# Patient Record
Sex: Male | Born: 1970
Health system: Southern US, Community
[De-identification: ages and names within clinical notes are randomized; demographics above are authoritative.]

## PROBLEM LIST (undated history)

## (undated) DIAGNOSIS — E119 Type 2 diabetes mellitus without complications: Secondary | ICD-10-CM

## (undated) DIAGNOSIS — R7402 Elevation of levels of lactic acid dehydrogenase (LDH): Secondary | ICD-10-CM

## (undated) DIAGNOSIS — E785 Hyperlipidemia, unspecified: Secondary | ICD-10-CM

## (undated) DIAGNOSIS — R7401 Elevation of levels of liver transaminase levels: Secondary | ICD-10-CM

## (undated) DIAGNOSIS — I10 Essential (primary) hypertension: Secondary | ICD-10-CM

## (undated) DIAGNOSIS — R740 Nonspecific elevation of levels of transaminase and lactic acid dehydrogenase [LDH]: Secondary | ICD-10-CM

## (undated) HISTORY — DX: Nonspecific elevation of levels of transaminase and lactic acid dehydrogenase (ldh): R74.0

## (undated) HISTORY — DX: Hyperlipidemia, unspecified: E78.5

## (undated) HISTORY — DX: Essential (primary) hypertension: I10

## (undated) HISTORY — DX: Type 2 diabetes mellitus without complications: E11.9

## (undated) HISTORY — DX: Elevation of levels of lactic acid dehydrogenase (LDH): R74.02

## (undated) HISTORY — DX: Elevation of levels of liver transaminase levels: R74.01

---

## 1998-12-18 HISTORY — PX: VASECTOMY: SHX75

## 2003-08-21 ENCOUNTER — Emergency Department (HOSPITAL_COMMUNITY): Admission: EM | Admit: 2003-08-21 | Discharge: 2003-08-22 | Payer: Self-pay | Admitting: Emergency Medicine

## 2003-08-21 ENCOUNTER — Encounter: Payer: Self-pay | Admitting: Emergency Medicine

## 2007-09-06 ENCOUNTER — Ambulatory Visit: Payer: Self-pay | Admitting: Family Medicine

## 2007-09-06 DIAGNOSIS — G43009 Migraine without aura, not intractable, without status migrainosus: Secondary | ICD-10-CM | POA: Insufficient documentation

## 2007-09-06 LAB — CONVERTED CEMR LAB
Bilirubin Urine: NEGATIVE
Blood in Urine, dipstick: NEGATIVE
Glucose, Urine, Semiquant: NEGATIVE
Ketones, urine, test strip: NEGATIVE
Nitrite: NEGATIVE
Protein, U semiquant: NEGATIVE
Specific Gravity, Urine: 1.01
Urobilinogen, UA: NEGATIVE
WBC Urine, dipstick: NEGATIVE
pH: 7.5

## 2007-09-10 ENCOUNTER — Ambulatory Visit: Payer: Self-pay | Admitting: Family Medicine

## 2007-09-11 ENCOUNTER — Encounter (INDEPENDENT_AMBULATORY_CARE_PROVIDER_SITE_OTHER): Payer: Self-pay | Admitting: *Deleted

## 2007-09-11 LAB — CONVERTED CEMR LAB
ALT: 54 units/L — ABNORMAL HIGH (ref 0–53)
AST: 30 units/L (ref 0–37)
Albumin: 4.1 g/dL (ref 3.5–5.2)
Alkaline Phosphatase: 92 units/L (ref 39–117)
BUN: 9 mg/dL (ref 6–23)
Basophils Absolute: 0 10*3/uL (ref 0.0–0.1)
Basophils Relative: 0.2 % (ref 0.0–1.0)
Bilirubin, Direct: 0.1 mg/dL (ref 0.0–0.3)
CO2: 28 meq/L (ref 19–32)
Calcium: 9.2 mg/dL (ref 8.4–10.5)
Chloride: 110 meq/L (ref 96–112)
Cholesterol: 162 mg/dL (ref 0–200)
Creatinine, Ser: 1 mg/dL (ref 0.4–1.5)
Eosinophils Absolute: 0.1 10*3/uL (ref 0.0–0.6)
Eosinophils Relative: 2.7 % (ref 0.0–5.0)
GFR calc Af Amer: 109 mL/min
GFR calc non Af Amer: 90 mL/min
Glucose, Bld: 116 mg/dL — ABNORMAL HIGH (ref 70–99)
HCT: 43.8 % (ref 39.0–52.0)
HDL: 23.7 mg/dL — ABNORMAL LOW (ref >39.0)
Hemoglobin: 15.4 g/dL (ref 13.0–17.0)
LDL Cholesterol: 111 mg/dL — ABNORMAL HIGH (ref 0–99)
Lipase: 34 units/L (ref 11.0–59.0)
Lymphocytes Relative: 31.8 % (ref 12.0–46.0)
MCHC: 35.2 g/dL (ref 30.0–36.0)
MCV: 90 fL (ref 78.0–100.0)
Monocytes Absolute: 0.6 10*3/uL (ref 0.2–0.7)
Monocytes Relative: 11.7 % — ABNORMAL HIGH (ref 3.0–11.0)
Neutro Abs: 2.9 10*3/uL (ref 1.4–7.7)
Neutrophils Relative %: 53.6 % (ref 43.0–77.0)
Platelets: 169 10*3/uL (ref 150–400)
Potassium: 4 meq/L (ref 3.5–5.1)
RBC: 4.86 M/uL (ref 4.22–5.81)
RDW: 11.8 % (ref 11.5–14.6)
Sodium: 143 meq/L (ref 135–145)
Total Bilirubin: 0.8 mg/dL (ref 0.3–1.2)
Total CHOL/HDL Ratio: 6.8
Total Protein: 6.7 g/dL (ref 6.0–8.3)
Triglycerides: 139 mg/dL (ref 0–149)
VLDL: 28 mg/dL (ref 0–40)
WBC: 5.3 10*3/uL (ref 4.5–10.5)

## 2009-03-19 ENCOUNTER — Emergency Department (HOSPITAL_COMMUNITY): Admission: EM | Admit: 2009-03-19 | Discharge: 2009-03-19 | Payer: Self-pay | Admitting: Emergency Medicine

## 2009-03-24 ENCOUNTER — Ambulatory Visit: Payer: Self-pay | Admitting: Family Medicine

## 2009-03-24 DIAGNOSIS — E1165 Type 2 diabetes mellitus with hyperglycemia: Secondary | ICD-10-CM | POA: Insufficient documentation

## 2009-03-24 DIAGNOSIS — E119 Type 2 diabetes mellitus without complications: Secondary | ICD-10-CM

## 2009-03-24 DIAGNOSIS — I10 Essential (primary) hypertension: Secondary | ICD-10-CM

## 2009-03-25 DIAGNOSIS — R740 Nonspecific elevation of levels of transaminase and lactic acid dehydrogenase [LDH]: Secondary | ICD-10-CM

## 2009-03-29 ENCOUNTER — Ambulatory Visit: Payer: Self-pay | Admitting: Family Medicine

## 2009-03-30 LAB — CONVERTED CEMR LAB
HCV Ab: NEGATIVE
Hep A IgM: NEGATIVE
Hep B C IgM: NEGATIVE
Hepatitis B Surface Ag: NEGATIVE

## 2009-03-31 ENCOUNTER — Encounter: Admission: RE | Admit: 2009-03-31 | Discharge: 2009-03-31 | Payer: Self-pay | Admitting: Family Medicine

## 2009-03-31 DIAGNOSIS — K760 Fatty (change of) liver, not elsewhere classified: Secondary | ICD-10-CM

## 2009-04-07 ENCOUNTER — Ambulatory Visit: Payer: Self-pay | Admitting: Family Medicine

## 2009-05-03 ENCOUNTER — Ambulatory Visit: Payer: Self-pay | Admitting: Family Medicine

## 2009-05-04 LAB — CONVERTED CEMR LAB
BUN: 15 mg/dL (ref 6–23)
CO2: 31 meq/L (ref 19–32)
Calcium: 9.1 mg/dL (ref 8.4–10.5)
Chloride: 111 meq/L (ref 96–112)
Creatinine, Ser: 1.1 mg/dL (ref 0.4–1.5)
GFR calc non Af Amer: 79.43 mL/min (ref >60)
Glucose, Bld: 108 mg/dL — ABNORMAL HIGH (ref 70–99)
Potassium: 3.8 meq/L (ref 3.5–5.1)
Sodium: 145 meq/L (ref 135–145)

## 2009-05-06 ENCOUNTER — Ambulatory Visit: Payer: Self-pay | Admitting: Family Medicine

## 2009-06-04 ENCOUNTER — Ambulatory Visit: Payer: Self-pay | Admitting: Family Medicine

## 2009-09-30 ENCOUNTER — Encounter (INDEPENDENT_AMBULATORY_CARE_PROVIDER_SITE_OTHER): Payer: Self-pay | Admitting: *Deleted

## 2010-06-08 ENCOUNTER — Emergency Department (HOSPITAL_COMMUNITY): Admission: EM | Admit: 2010-06-08 | Discharge: 2010-06-08 | Payer: Self-pay | Admitting: Family Medicine

## 2010-07-19 ENCOUNTER — Telehealth (INDEPENDENT_AMBULATORY_CARE_PROVIDER_SITE_OTHER): Payer: Self-pay | Admitting: *Deleted

## 2010-07-19 ENCOUNTER — Ambulatory Visit: Payer: Self-pay | Admitting: Family Medicine

## 2010-07-19 LAB — CONVERTED CEMR LAB
ALT: 57 units/L — ABNORMAL HIGH (ref 0–53)
Albumin: 4 g/dL (ref 3.5–5.2)
BUN: 14 mg/dL (ref 6–23)
Bilirubin, Direct: 0.1 mg/dL (ref 0.0–0.3)
CO2: 30 meq/L (ref 19–32)
Calcium: 9.4 mg/dL (ref 8.4–10.5)
Chloride: 108 meq/L (ref 96–112)
Cholesterol: 170 mg/dL (ref 0–200)
Creatinine, Ser: 1.1 mg/dL (ref 0.4–1.5)
HDL: 27.9 mg/dL — ABNORMAL LOW (ref >39.00)
Total Protein: 6.4 g/dL (ref 6.0–8.3)
Triglycerides: 200 mg/dL — ABNORMAL HIGH (ref 0.0–149.0)

## 2010-07-22 ENCOUNTER — Ambulatory Visit: Payer: Self-pay | Admitting: Family Medicine

## 2010-09-20 IMAGING — US US ABDOMEN COMPLETE
1 series · 14 of 25 positions shown · non-contrast
Comparison: None

CLINICAL DATA: Elevated liver function tests

COMPLETE ABDOMINAL ULTRASOUND

[Series 1: us abdomen complete · 0.35mm/px · 14 of 80 slices shown]
[im 1/80]
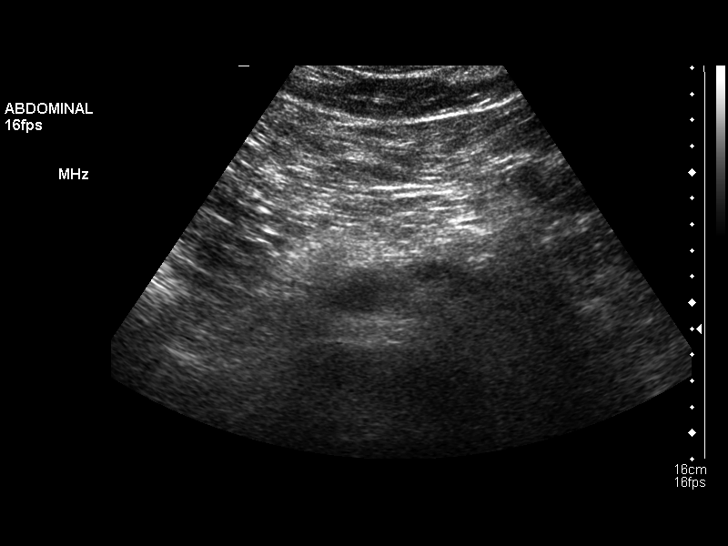
[im 7/80]
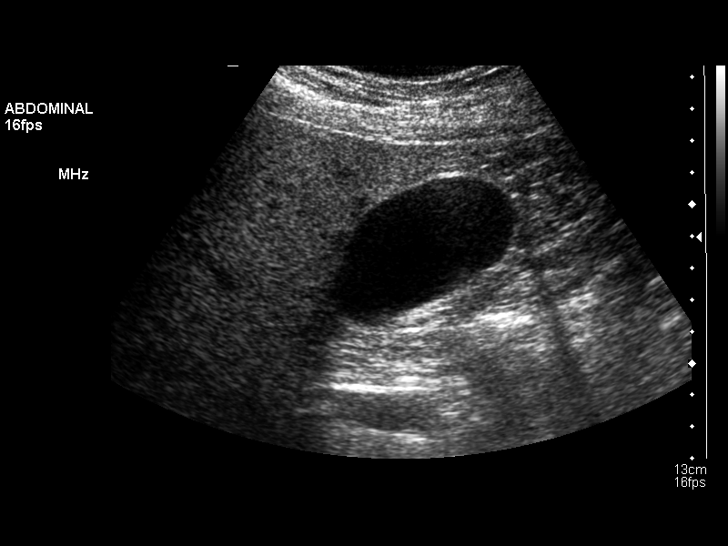
[im 14/80]
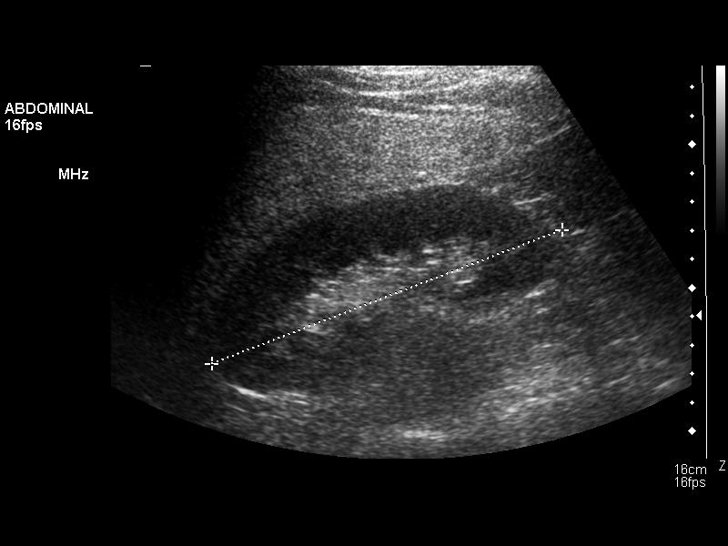
[im 20/80]
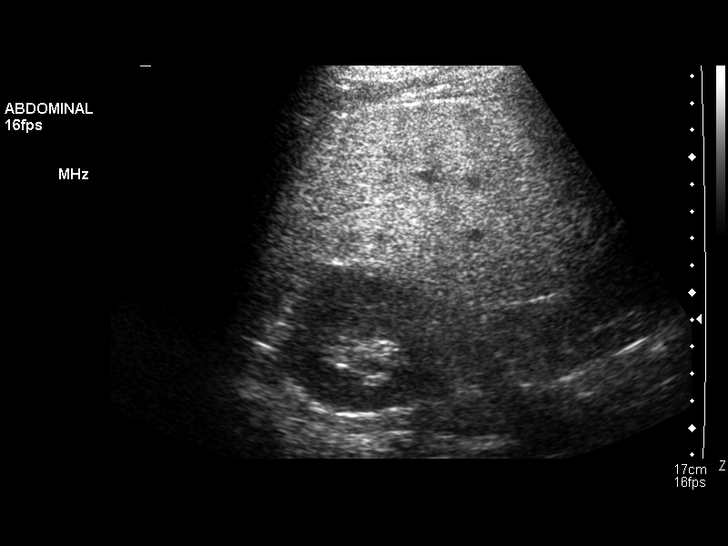
[im 27/80]
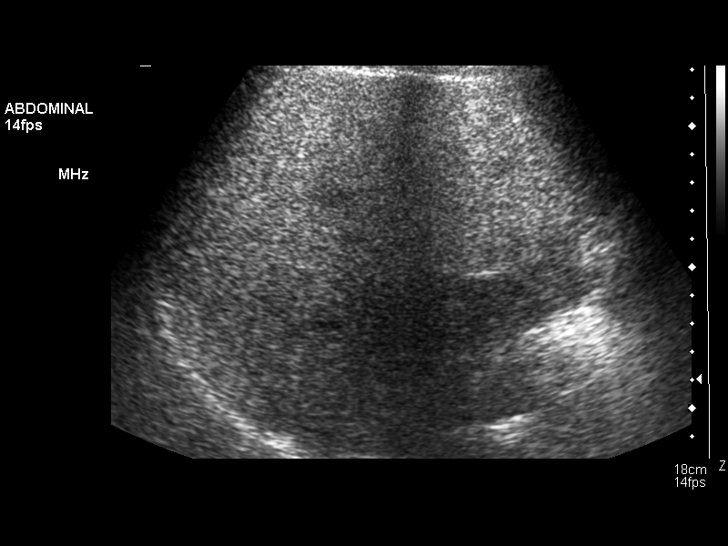
[im 30/80]
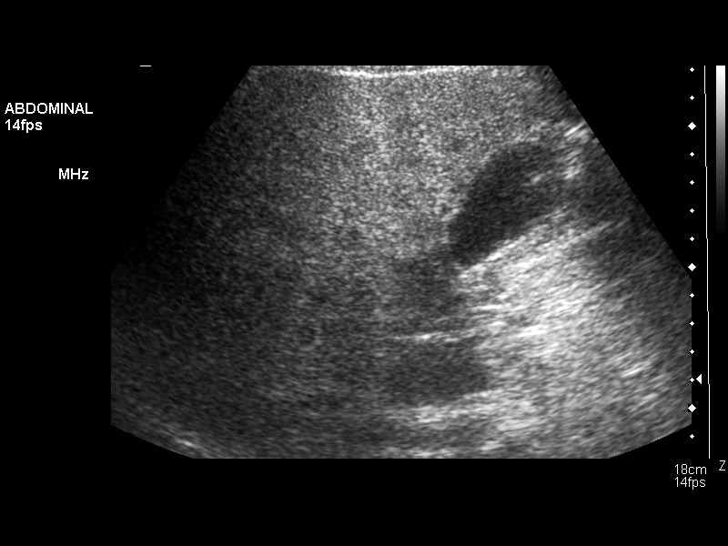
[im 37/80]
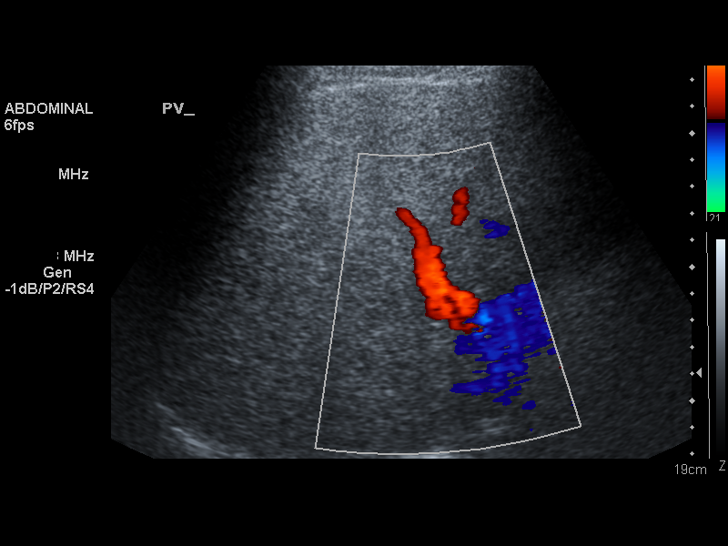
[im 43/80]
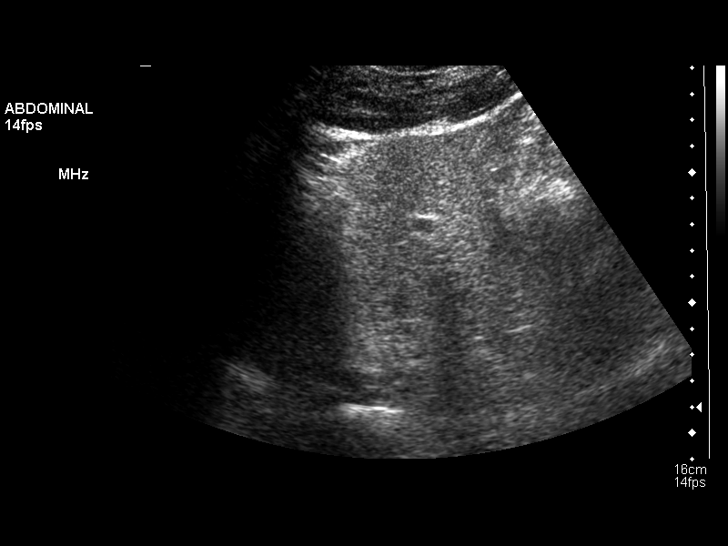
[im 50/80]
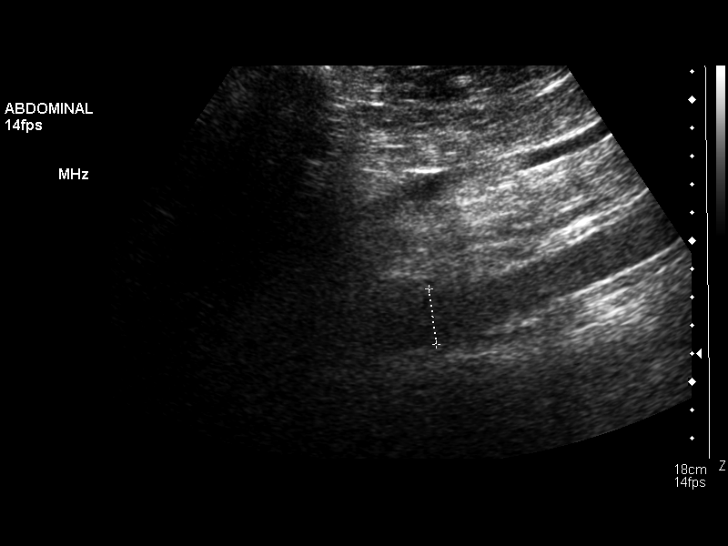
[im 53/80]
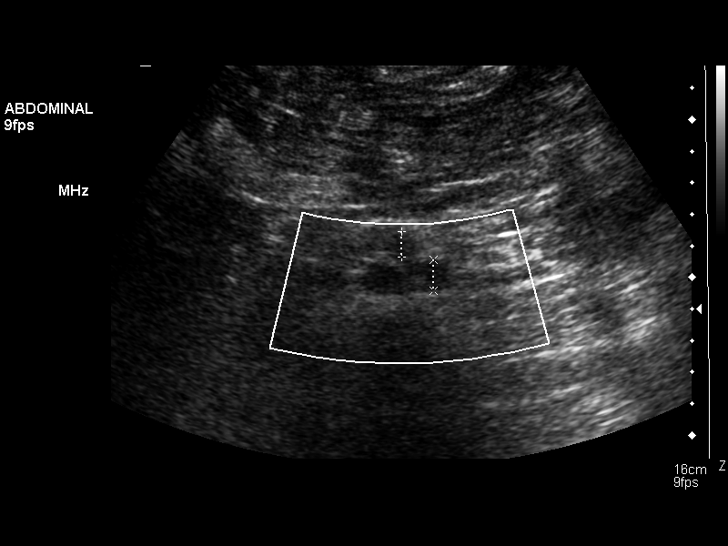
[im 60/80]
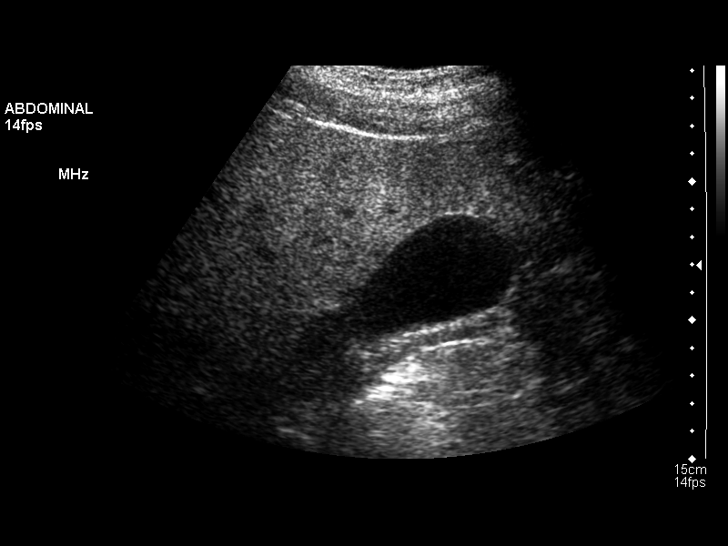
[im 66/80]
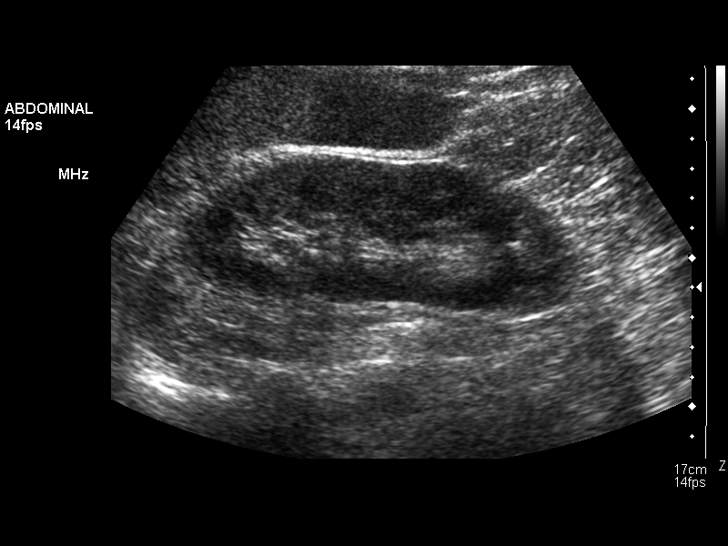
[im 73/80]
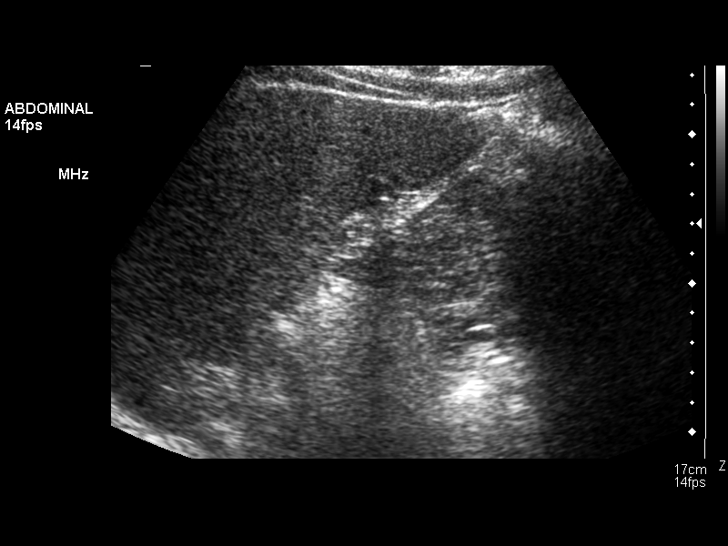
[im 80/80]
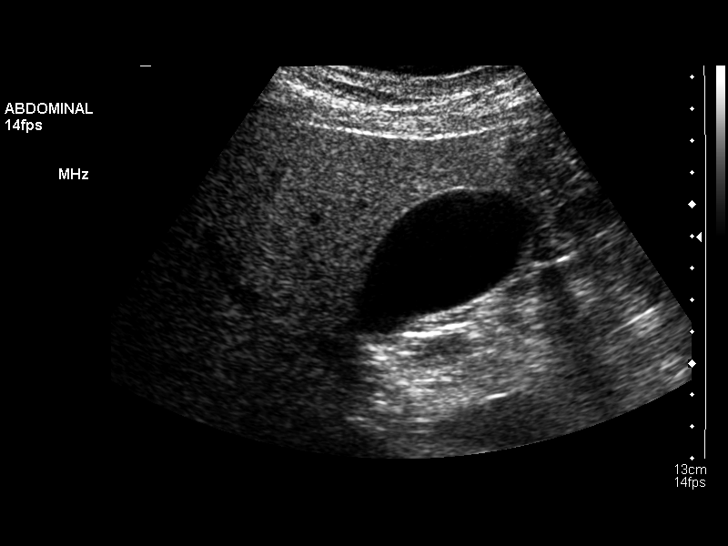

[14 of 25 positions shown; findings below may reference images not displayed]

FINDINGS: Gallbladder:  The gallbladder is well seen and no gallstones are
noted.  There is no pain upon compression over the gallbladder.

Common bile duct:  The common bile duct is normal measuring 3.1 mm.

Liver:  The liver is echogenic consistent with fatty infiltration.
No focal abnormality is seen.

IVC:  The infrahepatic IVC is obscured by bowel gas.

Pancreas:  The midportion of the pancreas is normal with head and
tail of the pancreas obscured by bowel gas.

Spleen:  The spleen is normal in size.

Right Kidney:  No hydronephrosis.  The right kidney measures
cm sagittally.

Left Kidney:  No hydronephrosis.  The left kidney measures 13.8 cm.

Abdominal aorta:  The abdominal aorta is normal in caliber.
IMPRESSION: 1.  No gallstones.  No ductal dilatation.
2.  Fatty infiltration of the liver.
3.  Bowel gas obscures portions of the pancreas.

## 2010-09-27 ENCOUNTER — Emergency Department (HOSPITAL_COMMUNITY): Admission: EM | Admit: 2010-09-27 | Discharge: 2010-09-27 | Payer: Self-pay | Admitting: Family Medicine

## 2010-12-26 ENCOUNTER — Other Ambulatory Visit: Payer: Self-pay | Admitting: Family Medicine

## 2010-12-26 ENCOUNTER — Ambulatory Visit
Admission: RE | Admit: 2010-12-26 | Discharge: 2010-12-26 | Payer: Self-pay | Source: Home / Self Care | Attending: Family Medicine | Admitting: Family Medicine

## 2010-12-26 LAB — LIPID PANEL
Cholesterol: 171 mg/dL (ref 0–200)
HDL: 25 mg/dL — ABNORMAL LOW (ref >39.00)
Total CHOL/HDL Ratio: 7
Triglycerides: 290 mg/dL — ABNORMAL HIGH (ref 0.0–149.0)
VLDL: 58 mg/dL — ABNORMAL HIGH (ref 0.0–40.0)

## 2010-12-26 LAB — LDL CHOLESTEROL, DIRECT: Direct LDL: 113.7 mg/dL

## 2011-01-15 LAB — CONVERTED CEMR LAB
ALT: 76 units/L — ABNORMAL HIGH (ref 0–53)
AST: 41 units/L — ABNORMAL HIGH (ref 0–37)
Albumin: 4 g/dL (ref 3.5–5.2)
Alkaline Phosphatase: 95 units/L (ref 39–117)
BUN: 11 mg/dL (ref 6–23)
Basophils Absolute: 0 10*3/uL (ref 0.0–0.1)
Basophils Relative: 0 % (ref 0.0–3.0)
Bilirubin Urine: NEGATIVE
Bilirubin, Direct: 0.1 mg/dL (ref 0.0–0.3)
Blood in Urine, dipstick: NEGATIVE
CO2: 30 meq/L (ref 19–32)
Calcium: 9.1 mg/dL (ref 8.4–10.5)
Chloride: 108 meq/L (ref 96–112)
Cholesterol: 158 mg/dL (ref 0–200)
Creatinine, Ser: 0.9 mg/dL (ref 0.4–1.5)
Eosinophils Absolute: 0.2 10*3/uL (ref 0.0–0.7)
Eosinophils Relative: 3.3 % (ref 0.0–5.0)
GFR calc non Af Amer: 100.19 mL/min (ref >60)
Glucose, Bld: 93 mg/dL (ref 70–99)
Glucose, Urine, Semiquant: NEGATIVE
HCT: 45.1 % (ref 39.0–52.0)
HDL: 22.6 mg/dL — ABNORMAL LOW (ref >39.00)
Hemoglobin: 16 g/dL (ref 13.0–17.0)
Ketones, urine, test strip: NEGATIVE
LDL Cholesterol: 102 mg/dL — ABNORMAL HIGH (ref 0–99)
Lymphocytes Relative: 32 % (ref 12.0–46.0)
Lymphs Abs: 2 10*3/uL (ref 0.7–4.0)
MCHC: 35.5 g/dL (ref 30.0–36.0)
MCV: 87.3 fL (ref 78.0–100.0)
Monocytes Absolute: 0.7 10*3/uL (ref 0.1–1.0)
Monocytes Relative: 11 % (ref 3.0–12.0)
Neutro Abs: 3.2 10*3/uL (ref 1.4–7.7)
Neutrophils Relative %: 53.7 % (ref 43.0–77.0)
Nitrite: NEGATIVE
Platelets: 160 10*3/uL (ref 150.0–400.0)
Potassium: 3.9 meq/L (ref 3.5–5.1)
RBC: 5.16 M/uL (ref 4.22–5.81)
RDW: 11.8 % (ref 11.5–14.6)
Sodium: 144 meq/L (ref 135–145)
Specific Gravity, Urine: 1.02
TSH: 0.93 microintl units/mL (ref 0.35–5.50)
Total Bilirubin: 1.2 mg/dL (ref 0.3–1.2)
Total CHOL/HDL Ratio: 7
Total Protein: 6.8 g/dL (ref 6.0–8.3)
Triglycerides: 165 mg/dL — ABNORMAL HIGH (ref 0.0–149.0)
Urobilinogen, UA: 0.2
VLDL: 33 mg/dL (ref 0.0–40.0)
WBC Urine, dipstick: NEGATIVE
WBC: 6.1 10*3/uL (ref 4.5–10.5)
pH: 6.5

## 2011-01-19 NOTE — Progress Notes (Signed)
----   Converted from flag ---- ---- 07/18/2010 5:28 PM, Kerby Nora MD wrote: Dx 401.1 CMEt, lipids fasting   ---- 07/18/2010 12:04 PM, Liane Comber CMA (AAMA) wrote: Pt is scheduled for cpx labs tomorrow, what labs to draw and dx codes? Thanks Tasha ------------------------------

## 2011-01-19 NOTE — Assessment & Plan Note (Signed)
Summary: cpx  cyd   Vital Signs:  Patient profile:   40 year old male Height:      70 inches Weight:      263.8 pounds BMI:     34.10 Temp:     98.6 degrees F oral Pulse rate:   76 / minute Pulse rhythm:   regular BP sitting:   124 / 84  (left arm) Cuff size:   regular  Vitals Entered By: Benny Lennert CMA Duncan Dull) (July 22, 2010 2:03 PM) CC: Hypertension Management   History of Present Illness: Chief complaint cpx  The patient is here for annual wellness exam and preventative care.    Exercsising 3 days a week. 10 lb weight gain in last year.  Fatty Liver Disease.Marland KitchenLFTs improved.  Prediabetes: improved.  Hypertension History:      He denies headache, chest pain, palpitations, dyspnea with exertion, peripheral edema, syncope, and side effects from treatment.  Well controlled.        Positive major cardiovascular risk factors include hypertension.  Negative major cardiovascular risk factors include male age less than 72 years old and non-tobacco-user status.     Problems Prior to Update: 1)  Uri  (ICD-465.9) 2)  Fatty Liver Disease  (ICD-571.8) 3)  Transaminases, Serum, Elevated  (ICD-790.4) 4)  Essential Hypertension, Benign  (ICD-401.1) 5)  Prediabetes  (ICD-790.29) 6)  Screening For Lipoid Disorders  (ICD-V77.91) 7)  Common Migraine  (ICD-346.10)  Current Medications (verified): 1)  Imitrex 50 Mg Tabs (Sumatriptan Succinate) .... As Needed.  ? Dosage 2)  Lisinopril-Hydrochlorothiazide 20-25 Mg Tabs (Lisinopril-Hydrochlorothiazide) .... Take 1 Tablet By Mouth Once A Day  Allergies (verified): No Known Drug Allergies  Past History:  Past medical, surgical, family and social histories (including risk factors) reviewed, and no changes noted (except as noted below).  Past Surgical History: Reviewed history from 09/06/2007 and no changes required. vasectomy 2000? broken L ankle  broken jaw MRI left knee  Family History: Reviewed history from 09/06/2007 and  no changes required. father age 53HTN, high chol mother age 26 DM PGF: CAD  no MI<age 36 aunt colon cancer age 30  Social History: Reviewed history from 09/06/2007 and no changes required. Occupation:buisness manager Married x 15 years children healthy  Never Smoked Alcohol use-no Drug use-no Regular exercise-yes, plays basketball, coach baseball Diet: no fruit, but some veggies, rare water, drinks a lot of caffiene in soda and sweet tea  Review of Systems       left eye hit with fishing lure 08/2009.Marland Kitcheneye exam by MD done...since tehn left pupil dilated General:  Denies fatigue and fever. CV:  Denies chest pain or discomfort. Resp:  Denies shortness of breath. GI:  Denies abdominal pain and bloody stools. GU:  Denies dysuria. Psych:  Denies anxiety and depression.  Physical Exam  General:  Well-developed,well-nourished,in no acute distress; alert,appropriate and cooperative throughout examination Head:  Normocephalic and atraumatic without obvious abnormalities. No apparent alopecia or balding. Eyes:  left pupil dilated and non reactive to light  EOMI Ears:  External ear exam shows no significant lesions or deformities.  Otoscopic examination reveals clear canals, tympanic membranes are intact bilaterally without bulging, retraction, inflammation or discharge. Hearing is grossly normal bilaterally. Nose:  External nasal examination shows no deformity or inflammation. Nasal mucosa are pink and moist without lesions or exudates. Mouth:  Oral mucosa and oropharynx without lesions or exudates.  Teeth in good repair. Neck:  no carotid bruit or thyromegaly no cervical or supraclavicular lymphadenopathy  Lungs:  Normal respiratory effort, chest expands symmetrically. Lungs are clear to auscultation, no crackles or wheezes. Heart:  Normal rate and regular rhythm. S1 and S2 normal without gallop, murmur, click, rub or other extra sounds. Abdomen:  Bowel sounds positive,abdomen soft and  non-tender without masses, organomegaly or hernias noted. Genitalia:  Testes bilaterally descended without nodularity, tenderness or masses. No scrotal masses or lesions. No penis lesions or urethral discharge. Pulses:  R and L posterior tibial pulses are full and equal bilaterally  Extremities:  no edema Skin:  Intact without suspicious lesions or rashes Psych:  Cognition and judgment appear intact. Alert and cooperative with normal attention span and concentration. No apparent delusions, illusions, hallucinations   Impression & Recommendations:  Problem # 1:  PREVENTIVE HEALTH CARE (ICD-V70.0) The patient's preventative maintenance and recommended screening tests for an annual wellness exam were reviewed in full today. Brought up to date unless services declined.  Counselled on the importance of diet, exercise, and its role in overall health and mortality. The patient's FH and SH was reviewed, including their home life, tobacco status, and drug and alcohol status.     Problem # 2:  FATTY LIVER DISEASE (ICD-571.8) Stable LFTS.  Problem # 3:  ESSENTIAL HYPERTENSION, BENIGN (ICD-401.1) Well controlled. Continue current medication.  His updated medication list for this problem includes:    Lisinopril-hydrochlorothiazide 20-25 Mg Tabs (Lisinopril-hydrochlorothiazide) .Marland Kitchen... Take 1 tablet by mouth once a day  Complete Medication List: 1)  Imitrex 50 Mg Tabs (Sumatriptan succinate) .... As needed.  ? dosage 2)  Lisinopril-hydrochlorothiazide 20-25 Mg Tabs (Lisinopril-hydrochlorothiazide) .... Take 1 tablet by mouth once a day  Hypertension Assessment/Plan:      The patient's hypertensive risk group is category A: No risk factors and no target organ damage.  His calculated 10 year risk of coronary heart disease is 6 %.  Today's blood pressure is 124/84.  His blood pressure goal is < 140/90.  Patient Instructions: 1)  Fish oil 2000 mg  divided daily. 2)  Increase exercsie. 3)   Increase  veggies fats instead of animal fats.Marland Kitchenalmonds, olive oil, avacado. 4)  Lipid panel in 6 months, fasting. Prescriptions: LISINOPRIL-HYDROCHLOROTHIAZIDE 20-25 MG TABS (LISINOPRIL-HYDROCHLOROTHIAZIDE) Take 1 tablet by mouth once a day  #90 x 3   Entered and Authorized by:   Kerby Nora MD   Signed by:   Kerby Nora MD on 07/22/2010   Method used:   Electronically to        [facility]* (retail)       [address]       [address] Shipping/mailing       [place], [place]  [address]       Ph: [phone]       Fax: [phone]   RxID:   [id number]   Current Allergies (reviewed today): No known allergies   TD Result Date:  03/27/2009 TD Result:  given in ER TD Next Due:  10 yr

## 2011-05-22 LAB — HM DIABETES EYE EXAM

## 2011-09-28 ENCOUNTER — Other Ambulatory Visit: Payer: Self-pay | Admitting: *Deleted

## 2011-09-28 MED ORDER — LISINOPRIL-HYDROCHLOROTHIAZIDE 20-25 MG PO TABS: 1.0000 | ORAL_TABLET | Freq: Every day | ORAL | Status: DC

## 2011-09-29 ENCOUNTER — Encounter: Payer: Self-pay | Admitting: Family Medicine

## 2011-10-02 ENCOUNTER — Ambulatory Visit: Payer: Self-pay | Admitting: Family Medicine

## 2011-10-02 DIAGNOSIS — Z0289 Encounter for other administrative examinations: Secondary | ICD-10-CM

## 2011-10-19 ENCOUNTER — Encounter: Payer: Self-pay | Admitting: Family Medicine

## 2011-10-19 ENCOUNTER — Ambulatory Visit (INDEPENDENT_AMBULATORY_CARE_PROVIDER_SITE_OTHER): Payer: 59 | Admitting: Family Medicine

## 2011-10-19 VITALS — BP 110/80 | HR 82 | Temp 98.2°F | Ht 70.0 in | Wt 276.1 lb

## 2011-10-19 DIAGNOSIS — E7800 Pure hypercholesterolemia, unspecified: Secondary | ICD-10-CM | POA: Insufficient documentation

## 2011-10-19 DIAGNOSIS — R200 Anesthesia of skin: Secondary | ICD-10-CM | POA: Insufficient documentation

## 2011-10-19 DIAGNOSIS — R7309 Other abnormal glucose: Secondary | ICD-10-CM

## 2011-10-19 DIAGNOSIS — I10 Essential (primary) hypertension: Secondary | ICD-10-CM

## 2011-10-19 DIAGNOSIS — R209 Unspecified disturbances of skin sensation: Secondary | ICD-10-CM

## 2011-10-19 DIAGNOSIS — R7402 Elevation of levels of lactic acid dehydrogenase (LDH): Secondary | ICD-10-CM

## 2011-10-19 DIAGNOSIS — E781 Pure hyperglyceridemia: Secondary | ICD-10-CM

## 2011-10-19 DIAGNOSIS — K7689 Other specified diseases of liver: Secondary | ICD-10-CM

## 2011-10-19 DIAGNOSIS — Z23 Encounter for immunization: Secondary | ICD-10-CM

## 2011-10-19 LAB — LIPID PANEL
Cholesterol: 174 mg/dL (ref 0–200)
Total CHOL/HDL Ratio: 5
VLDL: 40.2 mg/dL — ABNORMAL HIGH (ref 0.0–40.0)

## 2011-10-19 LAB — COMPREHENSIVE METABOLIC PANEL
BUN: 16 mg/dL (ref 6–23)
CO2: 29 mEq/L (ref 19–32)
Calcium: 9.3 mg/dL (ref 8.4–10.5)
Chloride: 102 mEq/L (ref 96–112)
Creatinine, Ser: 1.1 mg/dL (ref 0.4–1.5)
GFR: 80.11 mL/min (ref >60.00)
Glucose, Bld: 127 mg/dL — ABNORMAL HIGH (ref 70–99)
Total Bilirubin: 0.8 mg/dL (ref 0.3–1.2)

## 2011-10-19 LAB — VITAMIN B12: Vitamin B-12: 535 pg/mL (ref 211–911)

## 2011-10-19 NOTE — Assessment & Plan Note (Signed)
Due for re-eval. 

## 2011-10-19 NOTE — Progress Notes (Signed)
Subjective:    Patient ID: Philip Shaffer, male    DOB: January 19, 1971, 40 y.o.   MRN: [id number]  HPI  The patient is here for annual wellness exam and preventative care.    Hypertension:   Well controlled on lisonopril/HCTZ  Using medication without problems or lightheadedness:  Chest pain with exertion: None Edema:None Short of breath:None Average home BPs:not checking. Other issues: Working on trying to eat veggies, some fruit. Eats red meat, chicken. Deer.  Prediabetes: due for re-check. Not drinking soda as often.  High chol Overdue for re-eval... last check high tri in 12/2010. Off fish oil for 3 weeks, did not take regularly.  Fatty Liver: Due for  Re-eval.  Migraines improved: since BP being improved control.  Continues to have left eye pain with bright light. 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 Has not had eye exam in last year.   Notes numbness at night, that wakes him up in B arms intermittantly...off and on.  Gone once he gets moving around in AMs.  On computer a lot during the day. No issues during work. No neck pain,  No daytime numbness, no weakness.    Review of Systems  Constitutional: Negative for fever, fatigue and unexpected weight change.  HENT: Negative for ear pain, congestion, sore throat, rhinorrhea, trouble swallowing and postnasal drip.   Eyes: Negative for pain.  Respiratory: Negative for cough, shortness of breath and wheezing.   Cardiovascular: Negative for chest pain, palpitations and leg swelling.  Gastrointestinal: Negative for nausea, abdominal pain, diarrhea, constipation and blood in stool.  Genitourinary: Negative for dysuria, urgency, hematuria, discharge, penile swelling, scrotal swelling, difficulty urinating, penile pain and testicular pain.  Skin: Negative for rash.  Neurological: Negative for syncope, weakness, light-headedness, numbness and headaches.  Psychiatric/Behavioral: Negative for behavioral  problems and dysphoric mood. The patient is not nervous/anxious.        Objective:   Physical Exam  Constitutional: He is oriented to person, place, and time. He appears well-developed and well-nourished.  Non-toxic appearance. He does not appear ill. No distress.       overweight  HENT:  Head: Normocephalic and atraumatic.  Right Ear: Hearing, tympanic membrane, external ear and ear canal normal.  Left Ear: Hearing, tympanic membrane, external ear and ear canal normal.  Nose: Nose normal.  Mouth/Throat: Uvula is midline, oropharynx is clear and moist and mucous membranes are normal.  Eyes: Conjunctivae, EOM and lids are normal. No foreign bodies found. Left pupil is not reactive. Pupils are unequal.       chronic dilation of left pupil  Neck: Trachea normal, normal range of motion and phonation normal. Neck supple. Carotid bruit is not present. No mass and no thyromegaly present.  Cardiovascular: Normal rate, regular rhythm, S1 normal, S2 normal, intact distal pulses and normal pulses.  Exam reveals no gallop.   No murmur heard. Pulmonary/Chest: Breath sounds normal. He has no wheezes. He has no rhonchi. He has no rales.  Abdominal: Soft. Normal appearance and bowel sounds are normal. There is no hepatosplenomegaly. There is no tenderness. There is no rebound, no guarding and no CVA tenderness. No hernia. Hernia confirmed negative in the right inguinal area and confirmed negative in the left inguinal area.  Genitourinary: Prostate normal, testes normal and penis normal. Rectal exam shows no external hemorrhoid, no internal hemorrhoid, no fissure, no mass, no tenderness and anal tone normal. Guaiac negative stool. Prostate is not enlarged and not tender. Right testis shows  no mass and no tenderness. Left testis shows no mass and no tenderness. No paraphimosis or penile tenderness.  Lymphadenopathy:    He has no cervical adenopathy.       Right: No inguinal adenopathy present.       Left: No  inguinal adenopathy present.  Neurological: He is alert and oriented to person, place, and time. He has normal strength and normal reflexes. No cranial nerve deficit or sensory deficit. Gait normal.       Neg tinel and phalens B, neg Spurling, neg ulnar compression.  No neck pain, full ROM in neck    Skin: Skin is warm, dry and intact. No rash noted.  Psychiatric: He has a normal mood and affect. His speech is normal and behavior is normal. Judgment normal.          Assessment & Plan:  Complete Physical Exam: The patient's preventative maintenance and recommended screening tests for an annual wellness exam were reviewed in full today. Brought up to date unless services declined.  Counselled on the importance of diet, exercise, and its role in overall health and mortality. The patient's FH and SH was reviewed, including their home life, tobacco status, and drug and alcohol status.   Flu given, uptodate with Tdap. No early colon or prostate cacner in 1st degree relative.. Does have aunt with colon cancer in 2s.

## 2011-10-19 NOTE — Assessment & Plan Note (Signed)
Re-eval. 

## 2011-10-19 NOTE — Patient Instructions (Addendum)
Recommend yearly eye exam.  Re-eval neck [position and pillow as well as placement of arms at night.  We will call with lab results.

## 2011-10-19 NOTE — Assessment & Plan Note (Signed)
Well controlled. Continue current medication.  

## 2011-10-19 NOTE — Assessment & Plan Note (Signed)
Most consistent with nighttime compression of nerves in upper arm... No current sign of daily ulnar or radial compression.  Work on night time position.  Eval TSH and B12 for cause. No family hx of MS.

## 2011-10-19 NOTE — Assessment & Plan Note (Signed)
Off fish oil in last 3 weeks.. Due for trig re-eval.

## 2011-10-20 LAB — LDL CHOLESTEROL, DIRECT: Direct LDL: 122.4 mg/dL

## 2011-10-26 ENCOUNTER — Encounter: Payer: Self-pay | Admitting: Family Medicine

## 2011-10-31 ENCOUNTER — Telehealth: Payer: Self-pay | Admitting: *Deleted

## 2011-10-31 ENCOUNTER — Encounter: Payer: 59 | Admitting: Family Medicine

## 2011-10-31 MED ORDER — LISINOPRIL-HYDROCHLOROTHIAZIDE 20-25 MG PO TABS: 1.0000 | ORAL_TABLET | Freq: Every day | ORAL | Status: DC

## 2011-10-31 NOTE — Telephone Encounter (Signed)
Please apologize to him for my mistake about thinking he had an upcoming appt.  Notify pt B12, TSh nml.  Triglycerides are hoigh so.. I do recommend fish oil.. 2000 mg divided daily.  Also note blood sugar is elevated... Suggesting diagnosis of diabetes, no longer just prediabetes. Please have him make an appointment in next month to discuss futher evaluation and treatment of this.  Have him come in for A1C few days prior to appt.   Also: liver function test have increased from last check  Suggesting worsened fatty liver disease... Stop any alcohol, tylenol, work on low fat diet, lwering triglycerides will help as well.

## 2011-10-31 NOTE — Telephone Encounter (Signed)
Pt called for his lab results. You noted that you would discuss labs at his next office visit, but he just had cpe on 11/1.  He is also asking if he should take fish oil and, if so, how much and what kind.  Please advise.

## 2011-11-02 NOTE — Telephone Encounter (Signed)
Patient advised and appts made.

## 2011-11-03 ENCOUNTER — Other Ambulatory Visit: Payer: Self-pay | Admitting: *Deleted

## 2011-11-03 MED ORDER — LISINOPRIL-HYDROCHLOROTHIAZIDE 20-25 MG PO TABS: 1.0000 | ORAL_TABLET | Freq: Every day | ORAL | Status: DC

## 2011-12-06 ENCOUNTER — Other Ambulatory Visit (INDEPENDENT_AMBULATORY_CARE_PROVIDER_SITE_OTHER): Payer: 59

## 2011-12-06 DIAGNOSIS — E119 Type 2 diabetes mellitus without complications: Secondary | ICD-10-CM

## 2011-12-06 LAB — COMPREHENSIVE METABOLIC PANEL
ALT: 80 U/L — ABNORMAL HIGH (ref 0–53)
AST: 41 U/L — ABNORMAL HIGH (ref 0–37)
Alkaline Phosphatase: 68 U/L (ref 39–117)
BUN: 15 mg/dL (ref 6–23)
Calcium: 9.2 mg/dL (ref 8.4–10.5)
Chloride: 106 mEq/L (ref 96–112)
Creatinine, Ser: 1.2 mg/dL (ref 0.4–1.5)

## 2011-12-06 LAB — HEMOGLOBIN A1C: Hgb A1c MFr Bld: 6 % (ref 4.6–6.5)

## 2011-12-22 ENCOUNTER — Encounter: Payer: Self-pay | Admitting: Family Medicine

## 2011-12-22 ENCOUNTER — Ambulatory Visit (INDEPENDENT_AMBULATORY_CARE_PROVIDER_SITE_OTHER): Payer: 59 | Admitting: Family Medicine

## 2011-12-22 DIAGNOSIS — E781 Pure hyperglyceridemia: Secondary | ICD-10-CM

## 2011-12-22 DIAGNOSIS — I10 Essential (primary) hypertension: Secondary | ICD-10-CM

## 2011-12-22 DIAGNOSIS — E119 Type 2 diabetes mellitus without complications: Secondary | ICD-10-CM

## 2011-12-22 MED ORDER — LOSARTAN POTASSIUM-HCTZ 50-12.5 MG PO TABS: 1.0000 | ORAL_TABLET | Freq: Every day | ORAL | Status: DC

## 2011-12-22 MED ORDER — OMEGA-3-ACID ETHYL ESTERS 1 G PO CAPS: 2.0000 g | ORAL_CAPSULE | Freq: Two times a day (BID) | ORAL | Status: DC

## 2011-12-22 NOTE — Assessment & Plan Note (Signed)
Stable, due to fatty liver, work on lifestyle changes.

## 2011-12-22 NOTE — Progress Notes (Signed)
  Subjective:    Patient ID: Philip Shaffer, male    DOB: 04-24-1971, 41 y.o.   MRN: [id number]  HPI  Diabetes:  New diagnosis, well controlled.  Lab Results  Component Value Date   HGBA1C 6.0 12/06/2011  Has been working on lifestyle changes.. Stopped all soda.. Only drinking water.. Lost 6 lbs. Hypoglycemic episodes:? Hyperglycemic episodes:? Feet problems:None Blood Sugars averaging: Not checking eye exam within last year: yes  Hypertension:   At goal <130/80 on lisinopril/hctz  Using medication without problems or lightheadedness: Has daily cough.. Bothersome , interested in changing medication. Chest pain with exertion:None Edema:None Short of breath:None Average home BPs: 120/70-80s Other issues:  Elevated Cholesterol: Using medications without problems: Muscle aches:  Diet compliance: Exercise: Other complaints:    Review of Systems  Constitutional: Negative for fever and fatigue.  HENT: Negative for ear pain.   Eyes: Negative for pain.  Respiratory: Negative for cough, shortness of breath, wheezing and stridor.   Cardiovascular: Negative for chest pain.  Gastrointestinal: Negative for abdominal distention.       Objective:   Physical Exam  Constitutional: Vital signs are normal. He appears well-developed and well-nourished.       Central obesity  HENT:  Head: Normocephalic.  Right Ear: Hearing normal.  Left Ear: Hearing normal.  Nose: Nose normal.  Mouth/Throat: Oropharynx is clear and moist and mucous membranes are normal.  Neck: Trachea normal. Carotid bruit is not present. No mass and no thyromegaly present.  Cardiovascular: Normal rate, regular rhythm and normal pulses.  Exam reveals no gallop, no distant heart sounds and no friction rub.   No murmur heard.      No peripheral edema  Pulmonary/Chest: Effort normal and breath sounds normal. No respiratory distress.  Skin: Skin is warm, dry and intact. No rash noted.  Psychiatric: He has a  normal mood and affect. His speech is normal and behavior is normal. Thought content normal.    Diabetic foot exam: Normal inspection No skin breakdown No calluses  Normal DP pulses Normal sensation to light touch and monofilament Nails normal       Assessment & Plan:

## 2011-12-22 NOTE — Assessment & Plan Note (Signed)
Inadequate control, LDL goal now less than 100.  Will work on lifestyle changes. Encouraged exercise, weight loss, healthy eating habits. Recheck in 3 months.

## 2011-12-22 NOTE — Patient Instructions (Addendum)
Change BP med to losartan?HCTZ. Follow BP closely. Goal BP is <130/80. Call if running higher than this on new medication. Start lovaza or fish oil 2000 mg divided daily. Stop at front desk to to setp the referral. Follow up in 04/2012 for DM follow up labs prior.

## 2011-12-22 NOTE — Assessment & Plan Note (Signed)
At goal, but SE to lisinopril (cough). Change to losartan and follow BPs at home.

## 2011-12-22 NOTE — Assessment & Plan Note (Signed)
Well controlle don no med.. Refer to nutritionist. Recheck in 3 months.

## 2012-01-08 ENCOUNTER — Encounter: Payer: 59 | Attending: Family Medicine | Admitting: *Deleted

## 2012-01-08 DIAGNOSIS — I10 Essential (primary) hypertension: Secondary | ICD-10-CM | POA: Insufficient documentation

## 2012-01-08 DIAGNOSIS — E119 Type 2 diabetes mellitus without complications: Secondary | ICD-10-CM | POA: Insufficient documentation

## 2012-01-08 DIAGNOSIS — Z713 Dietary counseling and surveillance: Secondary | ICD-10-CM | POA: Insufficient documentation

## 2012-01-08 DIAGNOSIS — E781 Pure hyperglyceridemia: Secondary | ICD-10-CM | POA: Insufficient documentation

## 2012-01-09 ENCOUNTER — Encounter: Payer: Self-pay | Admitting: *Deleted

## 2012-01-09 NOTE — Patient Instructions (Signed)
Patient will attend Core Diabetes Courses II and III as scheduled or follow up prn.  

## 2012-01-09 NOTE — Progress Notes (Signed)
  Patient was seen on 01/08/12 for the first of a series of three diabetes self-management courses at the [facility]. The following learning objectives were met by the patient during this course:   Defines the role of glucose and insulin  Identifies type of diabetes and pathophysiology  Defines the diagnostic criteria for diabetes and prediabetes  States the risk factors for Type 2 Diabetes  States the symptoms of Type 2 Diabetes  Defines Type 2 Diabetes treatment goals  Defines Type 2 Diabetes treatment options  States the rationale for glucose monitoring  Identifies A1C, glucose targets, and testing times  Identifies proper sharps disposal  Defines the purpose of a diabetes food plan  Identifies carbohydrate food groups  Defines effects of carbohydrate foods on glucose levels  Identifies carbohydrate choices/grams/food labels  States benefits of physical activity and effect on glucose  Review of suggested activity guidelines  Lab Results  Component Value Date   HGBA1C 6.0 12/06/2011   Handouts given during class include:  Type 2 Diabetes: Basics Book  My Food Plan Book  Food and Activity Log  Patient has established the following initial goals:  Increase exercise.  Work on stress levels.  Monitor blood glucose.  Follow a diabetes meal plan.  Lose weight.  Follow-Up Plan: Pt to attend Core Diabetes Education Classes II and III in February 2013.

## 2012-01-30 ENCOUNTER — Ambulatory Visit: Payer: 59

## 2012-02-01 ENCOUNTER — Encounter: Payer: 59 | Attending: Family Medicine | Admitting: *Deleted

## 2012-02-01 DIAGNOSIS — E119 Type 2 diabetes mellitus without complications: Secondary | ICD-10-CM | POA: Insufficient documentation

## 2012-02-01 DIAGNOSIS — I10 Essential (primary) hypertension: Secondary | ICD-10-CM | POA: Insufficient documentation

## 2012-02-01 DIAGNOSIS — Z713 Dietary counseling and surveillance: Secondary | ICD-10-CM | POA: Insufficient documentation

## 2012-02-01 DIAGNOSIS — E781 Pure hyperglyceridemia: Secondary | ICD-10-CM | POA: Insufficient documentation

## 2012-02-02 ENCOUNTER — Encounter: Payer: Self-pay | Admitting: *Deleted

## 2012-02-02 NOTE — Patient Instructions (Signed)
Goals:  Follow Diabetes Meal Plan as instructed  Eat 3 meals and 2 snacks, every 3-5 hrs  Follow meal plan for number of grams of carbohydrate at meals and snacks  Add lean protein foods to meals/snacks  Monitor glucose levels as instructed by your doctor  Aim for 15-30 mins of physical activity daily  Bring food record and glucose log to your next nutrition visit

## 2012-02-02 NOTE — Progress Notes (Signed)
  Patient was seen on 02/01/2012 for the second of a series of three diabetes self-management courses at the [facility]. The following learning objectives were met by the patient during this course:   Explain basic nutrition maintenance and quality assurance  Describe causes, symptoms and treatment of hypoglycemia and hyperglycemia  Explain how to manage diabetes during illness  Describe the importance of good nutrition for health and healthy eating strategies  List strategies to follow meal plan when dining out  Describe the effects of alcohol on glucose and how to use it safely  Describe problem solving skills for day-to-day glucose challenges  Describe strategies to use when treatment plan needs to change  Identify important factors involved in successful weight loss  Describe ways to remain physically active  Describe the impact of regular activity on insulin resistance  Patient updated their initials goals from Core Class I to include:  Count carbs at most of my meals and snacks  Be active 45 minutes or more, 3 times per week  Handouts given in class:  Carb Counting and Beyond  [facility] Oral medication/insulin handout  Follow-Up Plan: Patient will attend the final class of the ADA Diabetes Self-Care Education.

## 2012-02-08 ENCOUNTER — Encounter: Payer: Self-pay | Admitting: *Deleted

## 2012-02-08 ENCOUNTER — Encounter: Payer: 59 | Admitting: *Deleted

## 2012-02-08 NOTE — Progress Notes (Signed)
  Patient was seen on 02/08/2012 for the third of a series of three diabetes self-management courses at the [facility]. The following learning objectives were met by the patient during this course:    Describe how diabetes changes over time   Identify diabetes complications and ways to prevent them   Describe strategies that can promote heart health including lowering blood pressure and cholesterol   Describe strategies to lower dietary fat and sodium in the diet   Identify physical activities that benefit cardiovascular health   Evaluate success in meeting personal goal   Describe the belief that they can live successfully with diabetes day to day   Establish 2-3 goals that they will plan to diligently work on until they return for the free 36-month follow-up visit  The following handouts were given in class:  3 Month Follow Up Visit handout  Goal setting handout  Class evaluation form  Your patient has established the following 3 month goals for diabetes self-care:  Count carbs at most meals and snacks  Be active 45 minutes or more 3 times a week  Follow-Up Plan: Patient will attend a 3 month follow-up visit for diabetes self-management education.

## 2012-02-20 ENCOUNTER — Encounter: Payer: Self-pay | Admitting: Family Medicine

## 2012-02-20 ENCOUNTER — Ambulatory Visit (INDEPENDENT_AMBULATORY_CARE_PROVIDER_SITE_OTHER): Payer: 59 | Admitting: Family Medicine

## 2012-02-20 DIAGNOSIS — M542 Cervicalgia: Secondary | ICD-10-CM

## 2012-02-20 MED ORDER — CYCLOBENZAPRINE HCL 10 MG PO TABS: 5.0000 mg | ORAL_TABLET | Freq: Three times a day (TID) | ORAL | Status: DC | PRN

## 2012-02-20 NOTE — Progress Notes (Signed)
Neck pain.  Started about 2 weeks ago.  Initially felt tight on L side.  No injury, trauma. Still with pain on L side, but worse pain now. No pain in arms, legs.  No FCNAVD.  No change in pillows.  Less pain and better ROM with advil.  Taking 2 advil a day.  No sx like this prev.    Meds, vitals, and allergies reviewed.   ROS: See HPI.  Otherwise, noncontributory.  nad ncat Mmm rrr ctab Neck with slight dec in ROM due to pain, L paraspinal C spine muscles ttp.  Not ttp on R side, no midline pain Normal rom/strength/sensation in the arms.

## 2012-02-20 NOTE — Patient Instructions (Signed)
Take the flexeril up to 3 times a day.  Take up to 3 of the over the counter ibuprofen up to 3 times a day (with food). Use a heating pad.  This should get better gradually.  Take care.

## 2012-02-21 DIAGNOSIS — M542 Cervicalgia: Secondary | ICD-10-CM | POA: Insufficient documentation

## 2012-02-21 NOTE — Assessment & Plan Note (Signed)
Likely benign MSK source.  Stretch, heat, nsaid with GI caution, flexeril with sedation caution.  F/u prn.  He agrees.

## 2012-04-18 ENCOUNTER — Other Ambulatory Visit (INDEPENDENT_AMBULATORY_CARE_PROVIDER_SITE_OTHER): Payer: 59

## 2012-04-18 DIAGNOSIS — E119 Type 2 diabetes mellitus without complications: Secondary | ICD-10-CM

## 2012-04-18 DIAGNOSIS — E781 Pure hyperglyceridemia: Secondary | ICD-10-CM

## 2012-04-18 LAB — LIPID PANEL
Cholesterol: 160 mg/dL (ref 0–200)
HDL: 29.6 mg/dL — ABNORMAL LOW (ref >39.00)
Total CHOL/HDL Ratio: 5
Triglycerides: 269 mg/dL — ABNORMAL HIGH (ref 0.0–149.0)

## 2012-04-18 LAB — COMPREHENSIVE METABOLIC PANEL
Albumin: 4 g/dL (ref 3.5–5.2)
Alkaline Phosphatase: 73 U/L (ref 39–117)
BUN: 15 mg/dL (ref 6–23)
Calcium: 9.2 mg/dL (ref 8.4–10.5)
Creatinine, Ser: 1.1 mg/dL (ref 0.4–1.5)
Glucose, Bld: 160 mg/dL — ABNORMAL HIGH (ref 70–99)
Potassium: 3.8 mEq/L (ref 3.5–5.1)

## 2012-04-18 LAB — LDL CHOLESTEROL, DIRECT: Direct LDL: 95 mg/dL

## 2012-04-18 LAB — HEMOGLOBIN A1C: Hgb A1c MFr Bld: 6.1 % (ref 4.6–6.5)

## 2012-04-25 ENCOUNTER — Ambulatory Visit (INDEPENDENT_AMBULATORY_CARE_PROVIDER_SITE_OTHER): Payer: 59 | Admitting: Family Medicine

## 2012-04-25 ENCOUNTER — Encounter: Payer: Self-pay | Admitting: Family Medicine

## 2012-04-25 DIAGNOSIS — K7689 Other specified diseases of liver: Secondary | ICD-10-CM

## 2012-04-25 DIAGNOSIS — I10 Essential (primary) hypertension: Secondary | ICD-10-CM

## 2012-04-25 DIAGNOSIS — L909 Atrophic disorder of skin, unspecified: Secondary | ICD-10-CM

## 2012-04-25 DIAGNOSIS — E7800 Pure hypercholesterolemia, unspecified: Secondary | ICD-10-CM

## 2012-04-25 DIAGNOSIS — L918 Other hypertrophic disorders of the skin: Secondary | ICD-10-CM

## 2012-04-25 DIAGNOSIS — E119 Type 2 diabetes mellitus without complications: Secondary | ICD-10-CM

## 2012-04-25 MED ORDER — CLOTRIMAZOLE-BETAMETHASONE 1-0.05 % EX LOTN: TOPICAL_LOTION | Freq: Two times a day (BID) | CUTANEOUS | Status: AC

## 2012-04-25 NOTE — Assessment & Plan Note (Signed)
LDL now at goal.. Will work on taking fish oil in separate dose for effectiveness. Encouraged exercise, weight loss, healthy eating habits.

## 2012-04-25 NOTE — Assessment & Plan Note (Signed)
Stable, slightly better with lifestyle changes

## 2012-04-25 NOTE — Assessment & Plan Note (Signed)
Well controlled with diet. Instructed to add exercise to regimen.

## 2012-04-25 NOTE — Assessment & Plan Note (Signed)
Borderline control on current dose. Follow at home, call with results if above goal.

## 2012-04-25 NOTE — Patient Instructions (Signed)
Work on low cholesterol diet, try taking second 2 tablets of fish oil at lunch at least. Start exercise regimen. Check blood pressure at home.. Call if greater than 130/80 consistently.

## 2012-04-25 NOTE — Progress Notes (Signed)
Diabetes:  well controlled, stable Lab Results  Component Value Date   HGBA1C 6.1 04/18/2012   Has been working on lifestyle changes.. Stopped all soda.. Only drinking water.. Lost 6 lbs.  Hypoglycemic episodes:?  Hyperglycemic episodes:?  Feet problems:None  Blood Sugars averaging: Not checking  eye exam within last year: yes  He has been going to see nutritionist.  Hypertension: Above goal <130/80  todayon lisinopril/hctz  Using medication without problems or lightheadedness: Has daily cough.. Bothersome , interested in changing medication.  Chest pain with exertion:None  Edema:None  Short of breath:None  Average home BPs: 120/70-80s  Other issues:   Elevated Cholesterol:  Now at goal <100.. Taking 3 in morning.. Not taking in evening. Using medications without problems:  Muscle aches:  Diet compliance: Moderate Exercise: None Other complaints:   Seen for neck pain in 02/2012.Marland Kitchen Now resolved. Occ comes and goes.  Has had a massage. No numbness, no weakness.  Review of Systems  Constitutional: Negative for fever and fatigue.  HENT: Negative for ear pain.  Eyes: Negative for pain.  Respiratory: Negative for cough, shortness of breath, wheezing and stridor.  Cardiovascular: Negative for chest pain.  Gastrointestinal: Negative for abdominal distention.    Objective:   Physical Exam  Constitutional: Vital signs are normal. He appears well-developed and well-nourished.  Central obesity  HENT:  Left pupil blown from past injury. Head: Normocephalic.  Right Ear: Hearing normal.  Left Ear: Hearing normal.  Nose: Nose normal.  Mouth/Throat: Oropharynx is clear and moist and mucous membranes are normal.  Neck: Trachea normal. Carotid bruit is not present. No mass and no thyromegaly present.  Cardiovascular: Normal rate, regular rhythm and normal pulses. Exam reveals no gallop, no distant heart sounds and no friction rub.  No murmur heard. No peripheral edema    Pulmonary/Chest: Effort normal and breath sounds normal. No respiratory distress.  Skin: Skin is warm, dry and intact. No rash noted.  HAs skin tags in left axillae..4 Psychiatric: He has a normal mood and affect. His speech is normal and behavior is normal. Thought content normal.   Diabetic foot exam:  Normal inspection  No skin breakdown  Mild calluses  Normal DP pulses  Normal sensation to light touch and monofilament  Nails normal except left great toenail injured, now healed.  Procedure Note: Area cleaned with alcohol, cold spray used to anesthitize. Scissors used to remove skin tags x 4. Minimal bleeding.

## 2012-05-16 ENCOUNTER — Ambulatory Visit: Payer: 59 | Admitting: *Deleted

## 2012-09-02 ENCOUNTER — Other Ambulatory Visit: Payer: Self-pay | Admitting: Family Medicine

## 2012-09-04 ENCOUNTER — Other Ambulatory Visit: Payer: Self-pay | Admitting: Family Medicine

## 2012-09-04 NOTE — Telephone Encounter (Signed)
Request for Lovaza 1 G capsule #[id number] refills. Sent to [facility].

## 2012-10-07 ENCOUNTER — Encounter: Payer: Self-pay | Admitting: Family Medicine

## 2012-10-07 ENCOUNTER — Ambulatory Visit (INDEPENDENT_AMBULATORY_CARE_PROVIDER_SITE_OTHER): Payer: 59 | Admitting: Family Medicine

## 2012-10-07 VITALS — BP 118/88 | HR 70 | Temp 98.0°F | Wt 280.0 lb

## 2012-10-07 DIAGNOSIS — R05 Cough: Secondary | ICD-10-CM

## 2012-10-07 MED ORDER — ALBUTEROL SULFATE HFA 108 (90 BASE) MCG/ACT IN AERS: 2.0000 | INHALATION_SPRAY | Freq: Four times a day (QID) | RESPIRATORY_TRACT | Status: DC | PRN

## 2012-10-07 MED ORDER — HYDROCODONE-HOMATROPINE 5-1.5 MG/5ML PO SYRP: 5.0000 mL | ORAL_SOLUTION | Freq: Every evening | ORAL | Status: DC | PRN

## 2012-10-07 NOTE — Progress Notes (Signed)
Cough.  Going on for >1 week.  Started with ST, that got better but the cough didn't improve.  He thought he was feeling better overall except for the cough and fatigue from cough disrupting his sleep.  No FCNAVD.  No ear pain, nosebleed.  Some rhinorrhea, intermittent.   Still has coughing fits.  Some blood streaked sputum last night, once, isolated.  Cough is worse at night.  Minimal sputum generally.  No aspirin, coumadin.  Never smoker.    Meds, vitals, and allergies reviewed.   ROS: See HPI.  Otherwise, noncontributory.  GEN: nad, alert and oriented HEENT: mucous membranes moist, tm w/o erythema, nasal exam w/o erythema, scant clear discharge noted,  OP with mild cobblestoning NECK: supple w/o LA CV: rrr.   PULM: ctab, no inc wob, dry cough noted EXT: no edema SKIN: no acute rash

## 2012-10-07 NOTE — Patient Instructions (Addendum)
Use the inhaler during the day and cough syrup at night.  The cough should gradually/slowly get better.  If you continue to have progressive cough or cough up more blood, then notify the clinic.  We'll need to recheck you at that point.  Take care.

## 2012-10-08 DIAGNOSIS — R05 Cough: Secondary | ICD-10-CM | POA: Insufficient documentation

## 2012-10-08 NOTE — Assessment & Plan Note (Signed)
Postviral, likely superficial irritation causing isolated hemoptysis.  Would use SABA and/or hycodan for cough and this cough should resolve slowly.  ctab today and no indication to image or treat with abx.  D/w pt. He agrees.  Well appearing.

## 2012-10-11 ENCOUNTER — Other Ambulatory Visit (INDEPENDENT_AMBULATORY_CARE_PROVIDER_SITE_OTHER): Payer: 59

## 2012-10-11 ENCOUNTER — Telehealth: Payer: Self-pay | Admitting: Family Medicine

## 2012-10-11 DIAGNOSIS — E7800 Pure hypercholesterolemia, unspecified: Secondary | ICD-10-CM

## 2012-10-11 DIAGNOSIS — E119 Type 2 diabetes mellitus without complications: Secondary | ICD-10-CM

## 2012-10-11 LAB — COMPREHENSIVE METABOLIC PANEL
ALT: 85 U/L — ABNORMAL HIGH (ref 0–53)
AST: 45 U/L — ABNORMAL HIGH (ref 0–37)
CO2: 27 mEq/L (ref 19–32)
Calcium: 9 mg/dL (ref 8.4–10.5)
Chloride: 102 mEq/L (ref 96–112)
Creatinine, Ser: 1 mg/dL (ref 0.4–1.5)
Potassium: 3.9 mEq/L (ref 3.5–5.1)
Sodium: 136 mEq/L (ref 135–145)
Total Protein: 6.8 g/dL (ref 6.0–8.3)

## 2012-10-11 LAB — LIPID PANEL
Cholesterol: 180 mg/dL (ref 0–200)
Total CHOL/HDL Ratio: 8
Triglycerides: 254 mg/dL — ABNORMAL HIGH (ref 0.0–149.0)
VLDL: 50.8 mg/dL — ABNORMAL HIGH (ref 0.0–40.0)

## 2012-10-11 NOTE — Telephone Encounter (Signed)
Message copied by Excell Seltzer on Fri Oct 11, 2012  8:27 AM ------      Message from: Baldomero Lamy      Created: Fri Oct 11, 2012  8:16 AM      Regarding: labs this am       Pt came in for cpx labs today, please order labs.       Thanks      Rodney Booze

## 2012-10-18 ENCOUNTER — Ambulatory Visit (INDEPENDENT_AMBULATORY_CARE_PROVIDER_SITE_OTHER): Payer: 59 | Admitting: Family Medicine

## 2012-10-18 ENCOUNTER — Encounter: Payer: Self-pay | Admitting: Family Medicine

## 2012-10-18 VITALS — BP 126/86 | HR 76 | Temp 98.2°F | Resp 16 | Ht 70.5 in | Wt 277.5 lb

## 2012-10-18 DIAGNOSIS — E7800 Pure hypercholesterolemia, unspecified: Secondary | ICD-10-CM

## 2012-10-18 DIAGNOSIS — I10 Essential (primary) hypertension: Secondary | ICD-10-CM

## 2012-10-18 DIAGNOSIS — E119 Type 2 diabetes mellitus without complications: Secondary | ICD-10-CM

## 2012-10-18 DIAGNOSIS — Z0000 Encounter for general adult medical examination without abnormal findings: Secondary | ICD-10-CM

## 2012-10-18 DIAGNOSIS — K7689 Other specified diseases of liver: Secondary | ICD-10-CM

## 2012-10-18 NOTE — Progress Notes (Signed)
Subjective:    Patient ID: Philip Shaffer, male    DOB: 01-13-1971, 41 y.o.   MRN: [id number]  HPI The patient is here for annual wellness exam and preventative care.    Hypertension:  Almost at goal On hyzaar  Using medication without problems or lightheadedness: None Chest pain with exertion:None Edema:None Short of breath:None Average home BPs:Not checking Other issues:  Elevated Cholesterol: Not at goal on no med Lab Results  Component Value Date   CHOL 180 10/11/2012   HDL 22.70* 10/11/2012   LDLCALC 102* 07/19/2010   LDLDIRECT 120.7 10/11/2012   TRIG 254.0* 10/11/2012   CHOLHDL 8 10/11/2012  Diet compliance:Good Exercise:None Other complaints:  Diabetes: At goal <7 a1c on no med Lab Results  Component Value Date   HGBA1C 6.9* 10/11/2012  Using medications without difficulties: Hypoglycemic episodes: Hyperglycemic episodes: Feet problems: Blood Sugars averaging: eye exam within last year:  Fatty liver disease:   Review of Systems  Constitutional: Negative for fever, fatigue and unexpected weight change.  HENT: Negative for ear pain, congestion, sore throat, rhinorrhea, trouble swallowing and postnasal drip.   Eyes: Negative for pain.  Respiratory: Negative for cough, shortness of breath and wheezing.   Cardiovascular: Negative for chest pain, palpitations and leg swelling.  Gastrointestinal: Negative for nausea, abdominal pain, diarrhea, constipation and blood in stool.  Genitourinary: Negative for dysuria, urgency, hematuria, discharge, penile swelling, scrotal swelling, difficulty urinating, penile pain and testicular pain.  Skin: Negative for rash.  Neurological: Negative for syncope, weakness, light-headedness, numbness and headaches.  Psychiatric/Behavioral: Negative for behavioral problems and dysphoric mood. The patient is not nervous/anxious.    Oc  Mild tenderness when laying on left side.    Objective:   Physical Exam  Constitutional: He  appears well-developed and well-nourished.  Non-toxic appearance. He does not appear ill. No distress.  HENT:  Head: Normocephalic and atraumatic.  Right Ear: Hearing, tympanic membrane, external ear and ear canal normal.  Left Ear: Hearing, tympanic membrane, external ear and ear canal normal.  Nose: Nose normal.  Mouth/Throat: Uvula is midline, oropharynx is clear and moist and mucous membranes are normal.  Eyes: Conjunctivae normal, EOM and lids are normal. Pupils are equal, round, and reactive to light. No foreign bodies found.  Neck: Trachea normal, normal range of motion and phonation normal. Neck supple. Carotid bruit is not present. No mass and no thyromegaly present.  Cardiovascular: Normal rate, regular rhythm, S1 normal, S2 normal, intact distal pulses and normal pulses.  Exam reveals no gallop.   No murmur heard. Pulmonary/Chest: Breath sounds normal. He has no wheezes. He has no rhonchi. He has no rales.  Abdominal: Soft. Normal appearance and bowel sounds are normal. There is no hepatosplenomegaly. There is no tenderness. There is no rebound, no guarding and no CVA tenderness. No hernia. Hernia confirmed negative in the right inguinal area and confirmed negative in the left inguinal area.  Genitourinary: Testes normal and penis normal. Right testis shows no mass and no tenderness. Left testis shows no mass and no tenderness. No paraphimosis or penile tenderness.  Lymphadenopathy:    He has no cervical adenopathy.       Right: No inguinal adenopathy present.       Left: No inguinal adenopathy present.  Neurological: He is alert. He has normal strength and normal reflexes. No cranial nerve deficit or sensory deficit. Gait normal.  Skin: Skin is warm, dry and intact. No rash noted.  Psychiatric: He has a normal mood and affect.  His speech is normal and behavior is normal. Judgment normal.    Diabetic foot exam: Normal inspection No skin breakdown No calluses  Normal DP  pulses Normal sensation to light touch and monofilament Nails normal       Assessment & Plan:  The patient's preventative maintenance and recommended screening tests for an annual wellness exam were reviewed in full today. Brought up to date unless services declined.  Counselled on the importance of diet, exercise, and its role in overall health and mortality. The patient's FH and SH was reviewed, including their home life, tobacco status, and drug and alcohol status.   Vaccines: uptodate, refuses flu. Refused STD testing. Nonsmoker

## 2012-10-18 NOTE — Patient Instructions (Addendum)
Starting exercise regimen 3-5 times a week, work on weight loss. Start red yeast rice 2400 mg daily. Continue lovaza. Follow up DM in 3 months with fasting labs prior.

## 2013-01-02 ENCOUNTER — Other Ambulatory Visit: Payer: Self-pay | Admitting: Family Medicine

## 2013-01-13 ENCOUNTER — Other Ambulatory Visit (INDEPENDENT_AMBULATORY_CARE_PROVIDER_SITE_OTHER): Payer: 59

## 2013-01-13 DIAGNOSIS — K7689 Other specified diseases of liver: Secondary | ICD-10-CM

## 2013-01-13 DIAGNOSIS — I10 Essential (primary) hypertension: Secondary | ICD-10-CM

## 2013-01-13 DIAGNOSIS — E7800 Pure hypercholesterolemia, unspecified: Secondary | ICD-10-CM

## 2013-01-13 DIAGNOSIS — E119 Type 2 diabetes mellitus without complications: Secondary | ICD-10-CM

## 2013-01-13 LAB — COMPREHENSIVE METABOLIC PANEL
Albumin: 3.9 g/dL (ref 3.5–5.2)
Alkaline Phosphatase: 74 U/L (ref 39–117)
BUN: 14 mg/dL (ref 6–23)
CO2: 30 mEq/L (ref 19–32)
GFR: 83.18 mL/min (ref >60.00)
Glucose, Bld: 175 mg/dL — ABNORMAL HIGH (ref 70–99)
Potassium: 3.8 mEq/L (ref 3.5–5.1)
Total Protein: 6.9 g/dL (ref 6.0–8.3)

## 2013-01-13 LAB — LIPID PANEL
Cholesterol: 157 mg/dL (ref 0–200)
Total CHOL/HDL Ratio: 7
Triglycerides: 225 mg/dL — ABNORMAL HIGH (ref 0.0–149.0)

## 2013-01-21 ENCOUNTER — Ambulatory Visit (INDEPENDENT_AMBULATORY_CARE_PROVIDER_SITE_OTHER): Payer: 59 | Admitting: Family Medicine

## 2013-01-21 ENCOUNTER — Encounter: Payer: Self-pay | Admitting: Family Medicine

## 2013-01-21 VITALS — BP 124/80 | HR 68 | Temp 98.2°F | Ht 70.5 in | Wt 279.5 lb

## 2013-01-21 DIAGNOSIS — E7800 Pure hypercholesterolemia, unspecified: Secondary | ICD-10-CM

## 2013-01-21 DIAGNOSIS — I10 Essential (primary) hypertension: Secondary | ICD-10-CM

## 2013-01-21 DIAGNOSIS — E119 Type 2 diabetes mellitus without complications: Secondary | ICD-10-CM

## 2013-01-21 DIAGNOSIS — K7689 Other specified diseases of liver: Secondary | ICD-10-CM

## 2013-01-21 NOTE — Assessment & Plan Note (Signed)
No improvement. Counseled on low fat diet, exercise and weight loss.

## 2013-01-21 NOTE — Patient Instructions (Addendum)
Continue working on exercise.. Try to go 3-5 times a week. Work on low fat low carb diet. Work on losing 10% current weight.

## 2013-01-21 NOTE — Progress Notes (Signed)
Hypertension: At goal on hyzaar  Using medication without problems or lightheadedness: None  Chest pain with exertion:None  Edema:None  Short of breath:None  Average home BPs:Not checking  Other issues:   Elevated Cholesterol: Now LDL at goal  on lovaza , HDL remains low and trigs high. He never tried red yeast rice. Lab Results  Component Value Date   CHOL 157 01/13/2013   HDL 23.20* 01/13/2013   LDLCALC 102* 07/19/2010   LDLDIRECT 92.7 01/13/2013   TRIG 225.0* 01/13/2013   CHOLHDL 7 01/13/2013   Diet compliance:Moderate Exercise: Has started going to gym. Other complaints:   Diabetes: At goal <7 a1c on no med  Lab Results  Component Value Date   HGBA1C 6.9* 01/13/2013   Using medications without difficulties:  Hypoglycemic episodes:  Hyperglycemic episodes:  Feet problems:  Blood Sugars averaging:  eye exam within last year:   Fatty liver disease:   Review of Systems  Constitutional: Negative for fever, fatigue and unexpected weight change.  HENT: Negative for ear pain, congestion, sore throat, rhinorrhea, trouble swallowing and postnasal drip.  Eyes: Negative for pain.  Respiratory: Negative for cough, shortness of breath and wheezing.  Cardiovascular: Negative for chest pain, palpitations and leg swelling.  Gastrointestinal: Negative for nausea, abdominal pain, diarrhea, constipation and blood in stool.  Genitourinary: Negative for dysuria, urgency, hematuria, discharge, penile swelling, scrotal swelling, difficulty urinating, penile pain and testicular pain.  Skin: Negative for rash.  Neurological: Negative for syncope, weakness, light-headedness, numbness and headaches.  Psychiatric/Behavioral: Negative for behavioral problems and dysphoric mood. The patient is not nervous/anxious.   Oc Mild tenderness when laying on left side.   Objective:   Physical Exam  Constitutional: He appears well-developed and well-nourished. Non-toxic appearance. He does not appear ill. No  distress.  HENT:  Head: Normocephalic and atraumatic.  Right Ear: Hearing, tympanic membrane, external ear and ear canal normal.  Left Ear: Hearing, tympanic membrane, external ear and ear canal normal.  Nose: Nose normal.  Mouth/Throat: Uvula is midline, oropharynx is clear and moist and mucous membranes are normal.  Eyes: Conjunctivae normal, EOM and lids are normal. Pupils are equal, round, and reactive to light. No foreign bodies found.  Neck: Trachea normal, normal range of motion and phonation normal. Neck supple. Carotid bruit is not present. No mass and no thyromegaly present.  Cardiovascular: Normal rate, regular rhythm, S1 normal, S2 normal, intact distal pulses and normal pulses. Exam reveals no gallop.  No murmur heard.  Pulmonary/Chest: Breath sounds normal. He has no wheezes. He has no rhonchi. He has no rales.  Abdominal: Soft. Normal appearance and bowel sounds are normal. There is no hepatosplenomegaly. There is no tenderness. There is no rebound, no guarding and no CVA tenderness. No hernia. Hernia confirmed negative in the right inguinal area and confirmed negative in the left inguinal area.  Genitourinary: Testes normal and penis normal. Right testis shows no mass and no tenderness. Left testis shows no mass and no tenderness. No paraphimosis or penile tenderness.  Lymphadenopathy:  He has no cervical adenopathy.  Right: No inguinal adenopathy present.  Left: No inguinal adenopathy present.  Neurological: He is alert. He has normal strength and normal reflexes. No cranial nerve deficit or sensory deficit. Gait normal.  Skin: Skin is warm, dry and intact. No rash noted.  Psychiatric: He has a normal mood and affect. His speech is normal and behavior is normal. Judgment normal.   Diabetic foot exam:  Normal inspection  No skin breakdown  No calluses  Normal DP pulses  Normal sensation to light touch and monofilament  Nails normal

## 2013-01-21 NOTE — Assessment & Plan Note (Signed)
LDl now at goal even though he never started red yeast rice. He has started exercsiing some. Trig and HDL slightly better.. Continue working on lifestyle changes.

## 2013-01-21 NOTE — Assessment & Plan Note (Signed)
Well controlled. Continue current medication.  

## 2013-01-21 NOTE — Assessment & Plan Note (Signed)
Stable control on diet Encouraged exercise, weight loss, healthy eating habits.  

## 2013-09-22 ENCOUNTER — Other Ambulatory Visit: Payer: Self-pay | Admitting: Family Medicine

## 2013-10-14 ENCOUNTER — Other Ambulatory Visit (INDEPENDENT_AMBULATORY_CARE_PROVIDER_SITE_OTHER): Payer: 59

## 2013-10-14 DIAGNOSIS — E119 Type 2 diabetes mellitus without complications: Secondary | ICD-10-CM

## 2013-10-14 DIAGNOSIS — E7800 Pure hypercholesterolemia, unspecified: Secondary | ICD-10-CM

## 2013-10-14 DIAGNOSIS — R7402 Elevation of levels of lactic acid dehydrogenase (LDH): Secondary | ICD-10-CM

## 2013-10-14 LAB — COMPREHENSIVE METABOLIC PANEL
ALT: 25 U/L (ref 0–53)
AST: 21 U/L (ref 0–37)
Calcium: 9.3 mg/dL (ref 8.4–10.5)
Chloride: 104 mEq/L (ref 96–112)
Creatinine, Ser: 1 mg/dL (ref 0.4–1.5)
Total Bilirubin: 1 mg/dL (ref 0.3–1.2)

## 2013-10-14 LAB — LIPID PANEL
HDL: 28.1 mg/dL — ABNORMAL LOW (ref >39.00)
Total CHOL/HDL Ratio: 5
VLDL: 37.4 mg/dL (ref 0.0–40.0)

## 2013-10-14 LAB — HEMOGLOBIN A1C: Hgb A1c MFr Bld: 5.2 % (ref 4.6–6.5)

## 2013-10-21 ENCOUNTER — Ambulatory Visit (INDEPENDENT_AMBULATORY_CARE_PROVIDER_SITE_OTHER): Payer: 59 | Admitting: Family Medicine

## 2013-10-21 ENCOUNTER — Encounter: Payer: Self-pay | Admitting: Family Medicine

## 2013-10-21 VITALS — BP 126/84 | HR 63 | Temp 98.3°F | Ht 70.25 in | Wt 248.8 lb

## 2013-10-21 DIAGNOSIS — E7800 Pure hypercholesterolemia, unspecified: Secondary | ICD-10-CM

## 2013-10-21 DIAGNOSIS — I10 Essential (primary) hypertension: Secondary | ICD-10-CM

## 2013-10-21 DIAGNOSIS — E119 Type 2 diabetes mellitus without complications: Secondary | ICD-10-CM

## 2013-10-21 DIAGNOSIS — Z0000 Encounter for general adult medical examination without abnormal findings: Secondary | ICD-10-CM

## 2013-10-21 NOTE — Progress Notes (Signed)
HPI  The patient is here for annual wellness exam and preventative care.    He has noted sensitive area  Of skin on right side  Of chest wall, ongoing for two week. Not itchy, sensitive. No rash associated. No upper back pain.  Has noted blood clot on skin of left scrotum x several months, non tender on left scrotum.  No scrotal pain or swelling.  Hypertension: Almost at goal on hyzaar  BP Readings from Last 3 Encounters:  10/21/13 126/84  01/21/13 124/80  10/18/12 126/86  Using medication without problems or lightheadedness: None  Chest pain with exertion:None, does have constant pain in right chest wall.. No change with exertion or eating, feels like sore muscle.Marland Kitchen Has been  presents for 6-12 months. Edema:None  Short of breath:None  Average home BPs:Not checking  Other issues:   Fatty liver disease:  Elevated LFTs: resolved with weight lsos.  Has lost 30 lbs going to the gym. Wt Readings from Last 3 Encounters:  10/21/13 248 lb 12 oz (112.832 kg)  01/21/13 279 lb 8 oz (126.78 kg)  10/18/12 277 lb 8 oz (125.873 kg)     Elevated Cholesterol: Now at goal on no med and trigs almost at goal on  lovaza. Lab Results  Component Value Date   CHOL 148 10/14/2013   HDL 28.10* 10/14/2013   LDLCALC 83 10/14/2013   LDLDIRECT 92.7 01/13/2013   TRIG 187.0* 10/14/2013   CHOLHDL 5 10/14/2013   Diet compliance: Good  Exercise: 3-4 days a week. Other complaints:   Diabetes: At goal <6.5 a1c on no med  Lab Results  Component Value Date   HGBA1C 5.2 10/14/2013   Using medications without difficulties:  Hypoglycemic episodes: ? Hyperglycemic episodes: ? Feet problems: None Blood Sugars averaging:  Not checking. eye exam within last year:  None   Review of Systems  Constitutional: Negative for fever, fatigue and unexpected weight change.  HENT: Negative for ear pain, congestion, sore throat, rhinorrhea, trouble swallowing and postnasal drip.  Eyes: Negative for pain.   Respiratory: Negative for cough, shortness of breath and wheezing.  Cardiovascular: Negative for chest pain, palpitations and leg swelling.  Gastrointestinal: Negative for nausea, abdominal pain, diarrhea, constipation and blood in stool.  Genitourinary: Negative for dysuria, urgency, hematuria, discharge, penile swelling, scrotal swelling, difficulty urinating, penile pain and testicular pain.  Skin: Negative for rash.  Neurological: Negative for syncope, weakness, light-headedness, numbness and headaches.  Psychiatric/Behavioral: Negative for behavioral problems and dysphoric mood. The patient is not nervous/anxious.  Oc Mild tenderness when laying on left side.  Objective:   Physical Exam  Constitutional: He appears well-developed and well-nourished. Non-toxic appearance. He does not appear ill. No distress.  HENT:  Head: Normocephalic and atraumatic.  Right Ear: Hearing, tympanic membrane, external ear and ear canal normal.  Left Ear: Hearing, tympanic membrane, external ear and ear canal normal.  Nose: Nose normal.  Mouth/Throat: Uvula is midline, oropharynx is clear and moist and mucous membranes are normal.  Eyes: Conjunctivae normal, EOM and lids are normal. Pupils are equal, round, and reactive to light. No foreign bodies found.  Neck: Trachea normal, normal range of motion and phonation normal. Neck supple. Carotid bruit is not present. No mass and no thyromegaly present.  Cardiovascular: Normal rate, regular rhythm, S1 normal, S2 normal, intact distal pulses and normal pulses. Exam reveals no gallop.  No murmur heard.  Pulmonary/Chest: Breath sounds normal. He has no wheezes. He has no rhonchi. He has no rales.  Abdominal:  Soft. Normal appearance and bowel sounds are normal. There is no hepatosplenomegaly. There is no tenderness. There is no rebound, no guarding and no CVA tenderness. No hernia. Hernia confirmed negative in the right inguinal area and confirmed negative in the left  inguinal area.  Genitourinary: Testes normal and penis normal. Right testis shows no mass and no tenderness. Left testis shows no mass and no tenderness. No paraphimosis or penile tenderness.  Small supperficial vein prominent on left scrotum... Consistent with varicose vein or small clot/ hemangioma Lymphadenopathy:  He has no cervical adenopathy.  Right: No inguinal adenopathy present.  Left: No inguinal adenopathy present.  Neurological: He is alert. He has normal strength and normal reflexes. No cranial nerve deficit or sensory deficit. Gait normal.  Skin: Skin is warm, dry and intact. No rash noted.  Psychiatric: He has a normal mood and affect. His speech is normal and behavior is normal. Judgment normal.   Diabetic foot exam:  Normal inspection  No skin breakdown  No calluses  Normal DP pulses  Normal sensation to light touch and monofilament  Nails normal  Assessment & Plan:   The patient's preventative maintenance and recommended screening tests for an annual wellness exam were reviewed in full today.  Brought up to date unless services declined.  Counselled on the importance of diet, exercise, and its role in overall health and mortality.  The patient's FH and SH was reviewed, including their home life, tobacco status, and drug and alcohol status.   Vaccines: uptodate td, refuses flu.  Refused STD testing.  Non smoker  No family hx of colon cancer and prostate cancer in first degree. He states he had nml PSA and rectal exam at a [facility].

## 2013-10-21 NOTE — Patient Instructions (Addendum)
Schedule yearly eye exam. Follow up DM, and HTN, cholesterol in 6 months with fasting labs prior.

## 2013-10-28 ENCOUNTER — Other Ambulatory Visit: Payer: Self-pay | Admitting: Family Medicine

## 2013-11-26 LAB — HM DIABETES EYE EXAM

## 2013-12-30 ENCOUNTER — Other Ambulatory Visit: Payer: Self-pay

## 2013-12-30 ENCOUNTER — Telehealth: Payer: Self-pay

## 2013-12-30 MED ORDER — LOSARTAN POTASSIUM-HCTZ 50-12.5 MG PO TABS: ORAL_TABLET | ORAL | Status: DC

## 2013-12-30 NOTE — Telephone Encounter (Signed)
Pt in [place] leaving on cruise and forgot BP med. Pt request refill losartan HCTZ to [facility]. Advised done.

## 2014-01-30 NOTE — Telephone Encounter (Signed)
See 12/30/13 refill note.

## 2014-02-22 LAB — HM DIABETES EYE EXAM

## 2014-03-12 ENCOUNTER — Other Ambulatory Visit: Payer: Self-pay | Admitting: Family Medicine

## 2014-04-16 ENCOUNTER — Telehealth: Payer: Self-pay | Admitting: Family Medicine

## 2014-04-16 DIAGNOSIS — E119 Type 2 diabetes mellitus without complications: Secondary | ICD-10-CM

## 2014-04-16 NOTE — Telephone Encounter (Signed)
Message copied by [name] on Thu Apr 16, 2014  8:36 AM ------      Message from: Ellamae Sia      Created: Tue Apr 07, 2014  3:52 PM      Regarding: Lab orders for Friday,5.1.15       Lab orders for a 6 month f/u ------

## 2014-04-17 ENCOUNTER — Other Ambulatory Visit (INDEPENDENT_AMBULATORY_CARE_PROVIDER_SITE_OTHER): Payer: 59

## 2014-04-17 DIAGNOSIS — E119 Type 2 diabetes mellitus without complications: Secondary | ICD-10-CM

## 2014-04-17 DIAGNOSIS — E7800 Pure hypercholesterolemia, unspecified: Secondary | ICD-10-CM

## 2014-04-17 DIAGNOSIS — R740 Nonspecific elevation of levels of transaminase and lactic acid dehydrogenase [LDH]: Secondary | ICD-10-CM

## 2014-04-17 DIAGNOSIS — K7689 Other specified diseases of liver: Secondary | ICD-10-CM

## 2014-04-17 DIAGNOSIS — R7402 Elevation of levels of lactic acid dehydrogenase (LDH): Secondary | ICD-10-CM

## 2014-04-17 DIAGNOSIS — I10 Essential (primary) hypertension: Secondary | ICD-10-CM

## 2014-04-17 DIAGNOSIS — R7401 Elevation of levels of liver transaminase levels: Secondary | ICD-10-CM

## 2014-04-17 LAB — COMPREHENSIVE METABOLIC PANEL
ALT: 40 U/L (ref 0–53)
AST: 27 U/L (ref 0–37)
Albumin: 4.2 g/dL (ref 3.5–5.2)
Alkaline Phosphatase: 67 U/L (ref 39–117)
BUN: 15 mg/dL (ref 6–23)
CALCIUM: 9.2 mg/dL (ref 8.4–10.5)
CHLORIDE: 104 meq/L (ref 96–112)
CO2: 29 mEq/L (ref 19–32)
CREATININE: 1.1 mg/dL (ref 0.4–1.5)
GFR: 78.32 mL/min (ref >60.00)
Glucose, Bld: 123 mg/dL — ABNORMAL HIGH (ref 70–99)
POTASSIUM: 3.7 meq/L (ref 3.5–5.1)
SODIUM: 139 meq/L (ref 135–145)
TOTAL PROTEIN: 6.9 g/dL (ref 6.0–8.3)
Total Bilirubin: 1.1 mg/dL (ref 0.3–1.2)

## 2014-04-17 LAB — LIPID PANEL
CHOL/HDL RATIO: 6
Cholesterol: 162 mg/dL (ref 0–200)
HDL: 26.5 mg/dL — ABNORMAL LOW (ref >39.00)
LDL Cholesterol: 94 mg/dL (ref 0–99)
TRIGLYCERIDES: 210 mg/dL — AB (ref 0.0–149.0)
VLDL: 42 mg/dL — ABNORMAL HIGH (ref 0.0–40.0)

## 2014-04-17 LAB — HEMOGLOBIN A1C: Hgb A1c MFr Bld: 5.4 % (ref 4.6–6.5)

## 2014-04-21 ENCOUNTER — Ambulatory Visit: Payer: 59 | Admitting: Family Medicine

## 2014-04-24 ENCOUNTER — Telehealth: Payer: Self-pay | Admitting: Family Medicine

## 2014-04-24 ENCOUNTER — Encounter: Payer: Self-pay | Admitting: Family Medicine

## 2014-04-24 ENCOUNTER — Ambulatory Visit (INDEPENDENT_AMBULATORY_CARE_PROVIDER_SITE_OTHER): Payer: 59 | Admitting: Family Medicine

## 2014-04-24 VITALS — BP 120/82 | HR 55 | Temp 98.2°F | Ht 70.25 in | Wt 272.8 lb

## 2014-04-24 DIAGNOSIS — I10 Essential (primary) hypertension: Secondary | ICD-10-CM

## 2014-04-24 DIAGNOSIS — E119 Type 2 diabetes mellitus without complications: Secondary | ICD-10-CM

## 2014-04-24 DIAGNOSIS — E7800 Pure hypercholesterolemia, unspecified: Secondary | ICD-10-CM

## 2014-04-24 LAB — HM DIABETES FOOT EXAM

## 2014-04-24 NOTE — Assessment & Plan Note (Signed)
DL at goal, but trg and HDL remain poor. Counseled on diet change and other lifestyle changes. Recheck in 6 months.

## 2014-04-24 NOTE — Patient Instructions (Addendum)
Get back on track with exercise and weight loss. Work on low fat low cholesterol diet. Avoid fast food, fried foods. Follow up for CPX with fasting labs in 6 months.   Diabetes and Exercise Exercising regularly is important. It is not just about losing weight. It has many health benefits, such as:  Improving your overall fitness, flexibility, and endurance.  Increasing your bone density.  Helping with weight control.  Decreasing your body fat.  Increasing your muscle strength.  Reducing stress and tension.  Improving your overall health. People with diabetes who exercise gain additional benefits because exercise:  Reduces appetite.  Improves the body's use of blood sugar (glucose).  Helps lower or control blood glucose.  Decreases blood pressure.  Helps control blood lipids (such as cholesterol and triglycerides).  Improves the body's use of the hormone insulin by:  Increasing the body's insulin sensitivity.  Reducing the body's insulin needs.  Decreases the risk for heart disease because exercising:  Lowers cholesterol and triglycerides levels.  Increases the levels of good cholesterol (such as high-density lipoproteins [HDL]) in the body.  Lowers blood glucose levels. YOUR ACTIVITY PLAN  Choose an activity that you enjoy and set realistic goals. Your health care provider or diabetes educator can help you make an activity plan that works for you. You can break activities into 2 or 3 sessions throughout the day. Doing so is as good as one long session. Exercise ideas include:  Taking the dog for a walk.  Taking the stairs instead of the elevator.  Dancing to your favorite song.  Doing your favorite exercise with a friend. RECOMMENDATIONS FOR EXERCISING WITH TYPE 1 OR TYPE 2 DIABETES   Check your blood glucose before exercising. If blood glucose levels are greater than 240 mg/dL, check for urine ketones. Do not exercise if ketones are present.  Avoid  injecting insulin into areas of the body that are going to be exercised. For example, avoid injecting insulin into:  The arms when playing tennis.  The legs when jogging.  Keep a record of:  Food intake before and after you exercise.  Expected peak times of insulin action.  Blood glucose levels before and after you exercise.  The type and amount of exercise you have done.  Review your records with your health care provider. Your health care provider will help you to develop guidelines for adjusting food intake and insulin amounts before and after exercising.  If you take insulin or oral hypoglycemic agents, watch for signs and symptoms of hypoglycemia. They include:  Dizziness.  Shaking.  Sweating.  Chills.  Confusion.  Drink plenty of water while you exercise to prevent dehydration or heat stroke. Body water is lost during exercise and must be replaced.  Talk to your health care provider before starting an exercise program to make sure it is safe for you. Remember, almost any type of activity is better than none. Document Released: 02/24/2004 Document Revised: 08/06/2013 Document Reviewed: 05/13/2013 [facility] Patient Information 2014 [place].

## 2014-04-24 NOTE — Progress Notes (Signed)
Pre visit review using our clinic review tool, if applicable. No additional management support is needed unless otherwise documented below in the visit note. 

## 2014-04-24 NOTE — Assessment & Plan Note (Signed)
Great control despite lack of exercise and weight gain back. Encouraged exercise, weight loss, healthy eating habits.

## 2014-04-24 NOTE — Assessment & Plan Note (Signed)
Well controlled. Continue current medication.  

## 2014-04-24 NOTE — Telephone Encounter (Signed)
Relevant patient education assigned to patient using Emmi. ° °

## 2014-04-24 NOTE — Progress Notes (Signed)
43 year old male presents for 6 months follow up DM.  Hypertension:At goal on Hyzaar < 130/90. BP Readings from Last 3 Encounters:  04/24/14 120/82  10/21/13 126/84  01/21/13 124/80  Using medication without problems or lightheadedness: None  Chest pain with exertion:None Edema:None  Short of breath:None  Average home BPs:Not checking  Other issues:  He has gained weight back.  Fatty liver disease: resolved Elevated LFTs:  Wt Readings from Last 3 Encounters:  04/24/14 272 lb 12 oz (123.719 kg)  10/21/13 248 lb 12 oz (112.832 kg)  01/21/13 279 lb 8 oz (126.78 kg)    Elevated Cholesterol: Now at goal on no med and trigs almost at goal on lovaza.  Lab Results  Component Value Date   CHOL 162 04/17/2014   HDL 26.50* 04/17/2014   LDLCALC 94 04/17/2014   LDLDIRECT 92.7 01/13/2013   TRIG 210.0* 04/17/2014   CHOLHDL 6 04/17/2014  Diet compliance: Good  Exercise: minimal  Other complaints:   Diabetes: At goal <6.5 a1c on no med  Lab Results  Component Value Date   HGBA1C 5.4 04/17/2014  Using medications without difficulties:  Hypoglycemic episodes: ?  Hyperglycemic episodes: ?  Feet problems: None  Blood Sugars averaging: Not checking.  eye exam within last year: yes  Review of Systems  Constitutional: Negative for fever, fatigue and unexpected weight change.  HENT: Negative for ear pain, congestion, sore throat, rhinorrhea, trouble swallowing and postnasal drip.  Eyes: Negative for pain.  Respiratory: Negative for cough, shortness of breath and wheezing.  Cardiovascular: Negative for chest pain, palpitations and leg swelling.  Gastrointestinal: Negative for nausea, abdominal pain, diarrhea, constipation and blood in stool.  Genitourinary: Negative for dysuria, urgency, hematuria, discharge, penile swelling, scrotal swelling, difficulty urinating, penile pain and testicular pain.  Skin: Negative for rash.  Neurological: Negative for syncope, weakness, light-headedness, numbness and  headaches.  Psychiatric/Behavioral: Negative for behavioral problems and dysphoric mood. The patient is not nervous/anxious.  Oc Mild tenderness when laying on left side.  Objective:   Physical Exam  Constitutional: He appears well-developed and well-nourished. Non-toxic appearance. He does not appear ill. No distress.  HENT:  Head: Normocephalic and atraumatic.  Right Ear: Hearing, tympanic membrane, external ear and ear canal normal.  Left Ear: Hearing, tympanic membrane, external ear and ear canal normal.  Nose: Nose normal.  Mouth/Throat: Uvula is midline, oropharynx is clear and moist and mucous membranes are normal.  Eyes: Conjunctivae normal, EOM and lids are normal. Pupils are equal, round, and reactive to light. No foreign bodies found.  Neck: Trachea normal, normal range of motion and phonation normal. Neck supple. Carotid bruit is not present. No mass and no thyromegaly present.  Cardiovascular: Normal rate, regular rhythm, S1 normal, S2 normal, intact distal pulses and normal pulses. Exam reveals no gallop.  No murmur heard.  Pulmonary/Chest: Breath sounds normal. He has no wheezes. He has no rhonchi. He has no rales.  Abdominal: Soft. Normal appearance and bowel sounds are normal. There is no hepatosplenomegaly. There is no tenderness. There is no rebound, no guarding and no CVA tenderness. No hernia.  Lymphadenopathy:  He has no cervical adenopathy.  Right: No inguinal adenopathy present.  Left: No inguinal adenopathy present.  Neurological: He is alert. He has normal strength and normal reflexes. No cranial nerve deficit or sensory deficit. Gait normal.  Skin: Skin is warm, dry and intact. No rash noted.  Psychiatric: He has a normal mood and affect. His speech is normal and behavior  is normal. Judgment normal.   Diabetic foot exam:  Normal inspection  No skin breakdown  No calluses  Normal DP pulses  Normal sensation to light touch and monofilament  Nails normal

## 2014-05-13 ENCOUNTER — Telehealth: Payer: Self-pay

## 2014-05-13 NOTE — Telephone Encounter (Signed)
Relevant patient education assigned to patient using Emmi. ° °

## 2014-10-13 ENCOUNTER — Other Ambulatory Visit: Payer: Self-pay | Admitting: Family Medicine

## 2014-10-19 ENCOUNTER — Telehealth: Payer: Self-pay | Admitting: Family Medicine

## 2014-10-19 DIAGNOSIS — I10 Essential (primary) hypertension: Secondary | ICD-10-CM

## 2014-10-19 DIAGNOSIS — E119 Type 2 diabetes mellitus without complications: Secondary | ICD-10-CM

## 2014-10-19 DIAGNOSIS — E7800 Pure hypercholesterolemia, unspecified: Secondary | ICD-10-CM

## 2014-10-19 NOTE — Telephone Encounter (Signed)
-----   Message from Ellamae Sia sent at 10/14/2014 12:11 PM EDT ----- Regarding: Lab orders for Monday, 11.2.15 Patient is scheduled for CPX labs, please order future labs, Thanks , Karna Christmas

## 2014-10-20 ENCOUNTER — Other Ambulatory Visit: Payer: 59

## 2014-10-23 ENCOUNTER — Other Ambulatory Visit (INDEPENDENT_AMBULATORY_CARE_PROVIDER_SITE_OTHER): Payer: 59

## 2014-10-23 DIAGNOSIS — E7800 Pure hypercholesterolemia, unspecified: Secondary | ICD-10-CM

## 2014-10-23 DIAGNOSIS — E119 Type 2 diabetes mellitus without complications: Secondary | ICD-10-CM

## 2014-10-23 LAB — COMPREHENSIVE METABOLIC PANEL
ALBUMIN: 3.7 g/dL (ref 3.5–5.2)
ALT: 69 U/L — ABNORMAL HIGH (ref 0–53)
AST: 37 U/L (ref 0–37)
Alkaline Phosphatase: 68 U/L (ref 39–117)
BUN: 13 mg/dL (ref 6–23)
CO2: 34 mEq/L — ABNORMAL HIGH (ref 19–32)
Calcium: 9.2 mg/dL (ref 8.4–10.5)
Chloride: 102 mEq/L (ref 96–112)
Creatinine, Ser: 1.2 mg/dL (ref 0.4–1.5)
GFR: 70.61 mL/min (ref >60.00)
GLUCOSE: 136 mg/dL — AB (ref 70–99)
POTASSIUM: 3.8 meq/L (ref 3.5–5.1)
Sodium: 138 mEq/L (ref 135–145)
TOTAL PROTEIN: 7 g/dL (ref 6.0–8.3)
Total Bilirubin: 1 mg/dL (ref 0.2–1.2)

## 2014-10-23 LAB — LIPID PANEL
CHOL/HDL RATIO: 8
CHOLESTEROL: 176 mg/dL (ref 0–200)
HDL: 20.8 mg/dL — ABNORMAL LOW (ref >39.00)
NonHDL: 155.2
Triglycerides: 281 mg/dL — ABNORMAL HIGH (ref 0.0–149.0)
VLDL: 56.2 mg/dL — AB (ref 0.0–40.0)

## 2014-10-23 LAB — HEMOGLOBIN A1C: HEMOGLOBIN A1C: 5.9 % (ref 4.6–6.5)

## 2014-10-23 LAB — LDL CHOLESTEROL, DIRECT: LDL DIRECT: 99.1 mg/dL

## 2014-10-27 ENCOUNTER — Encounter: Payer: Self-pay | Admitting: Family Medicine

## 2014-10-27 ENCOUNTER — Ambulatory Visit (INDEPENDENT_AMBULATORY_CARE_PROVIDER_SITE_OTHER): Payer: 59 | Admitting: Family Medicine

## 2014-10-27 VITALS — BP 130/84 | HR 62 | Temp 98.2°F | Ht 70.25 in | Wt 277.8 lb

## 2014-10-27 DIAGNOSIS — I10 Essential (primary) hypertension: Secondary | ICD-10-CM

## 2014-10-27 DIAGNOSIS — Z0000 Encounter for general adult medical examination without abnormal findings: Secondary | ICD-10-CM

## 2014-10-27 DIAGNOSIS — E7800 Pure hypercholesterolemia, unspecified: Secondary | ICD-10-CM

## 2014-10-27 DIAGNOSIS — E119 Type 2 diabetes mellitus without complications: Secondary | ICD-10-CM

## 2014-10-27 LAB — HM DIABETES FOOT EXAM

## 2014-10-27 NOTE — Progress Notes (Signed)
The patient is here for annual wellness exam and preventative care.   He has lesion on right lateral arm, getting bigger in last year. No itchy, no bleeding, no irritation.  Hypertension: Almost at goal on hyzaar BP Readings from Last 3 Encounters:  10/27/14 130/84  04/24/14 120/82  10/21/13 126/84  Using medication without problems or lightheadedness: None Chest pain with exertion:None Edema:None Short of breath:None Average home BPs:Not checking Other issues:  Elevated Cholesterol: Not at goal on no med Lab Results  Component Value Date   CHOL 176 10/23/2014   HDL 20.80* 10/23/2014   LDLCALC 94 04/17/2014   LDLDIRECT 99.1 10/23/2014   TRIG 281.0* 10/23/2014   CHOLHDL 8 10/23/2014  Diet compliance:Good Exercise:None   Wt Readings from Last 3 Encounters:  10/27/14 277 lb 12 oz (125.987 kg)  04/24/14 272 lb 12 oz (123.719 kg)  10/21/13 248 lb 12 oz (112.832 kg)    Diabetes: At goal <7 a1c on no med. Lab Results  Component Value Date   HGBA1C 5.9 10/23/2014  Hypoglycemic episodes:None Hyperglycemic episodes:None Feet problems:None Blood Sugars averaging: not checking eye exam within last year:yes  Fatty liver disease:  Slight elevated LFTs.  Review of Systems  Constitutional: Negative for fever, fatigue and unexpected weight change.  HENT: Negative for ear pain, congestion, sore throat, rhinorrhea, trouble swallowing and postnasal drip.  Eyes: Negative for pain.  Respiratory: Negative for cough, shortness of breath and wheezing.  Cardiovascular: Negative for chest pain, palpitations and leg swelling.  Gastrointestinal: Negative for nausea, abdominal pain, diarrhea, constipation and blood in stool.  Genitourinary: Negative for dysuria, urgency, hematuria, discharge, penile swelling, scrotal swelling, difficulty urinating, penile pain and testicular pain.  Skin: Negative for rash.  Neurological: Negative for syncope, weakness, light-headedness, numbness  and headaches.  Psychiatric/Behavioral: Negative for behavioral problems and dysphoric mood. The patient is not nervous/anxious.   Oc Mild tenderness when laying on left side.    Objective:   Physical Exam  Constitutional: He appears well-developed and well-nourished. Non-toxic appearance. He does not appear ill. No distress.  HENT:  Head: Normocephalic and atraumatic.  Right Ear: Hearing, tympanic membrane, external ear and ear canal normal.  Left Ear: Hearing, tympanic membrane, external ear and ear canal normal.  Nose: Nose normal.  Mouth/Throat: Uvula is midline, oropharynx is clear and moist and mucous membranes are normal.  Eyes: Conjunctivae normal, EOM and lids are normal. Pupils are equal, round, and reactive to light. No foreign bodies found.  Neck: Trachea normal, normal range of motion and phonation normal. Neck supple. Carotid bruit is not present. No mass and no thyromegaly present.  Cardiovascular: Normal rate, regular rhythm, S1 normal, S2 normal, intact distal pulses and normal pulses. Exam reveals no gallop.  No murmur heard. Pulmonary/Chest: Breath sounds normal. He has no wheezes. He has no rhonchi. He has no rales.  Abdominal: Soft. Normal appearance and bowel sounds are normal. There is no hepatosplenomegaly. There is no tenderness. There is no rebound, no guarding and no CVA tenderness. No hernia. Hernia confirmed negative in the right inguinal area and confirmed negative in the left inguinal area.  Genitourinary: Testes normal and penis normal. Right testis shows no mass and no tenderness. Left testis shows no mass and no tenderness. No paraphimosis or penile tenderness.  Lymphadenopathy:   He has no cervical adenopathy.   Right: No inguinal adenopathy present.   Left: No inguinal adenopathy present.  Neurological: He is alert. He has normal strength and normal reflexes. No cranial nerve  deficit or sensory deficit. Gait normal.  Skin: Skin is  warm, dry and intact. No rash noted.  Psychiatric: He has a normal mood and affect. His speech is normal and behavior is normal. Judgment normal.    Diabetic foot exam: Normal inspection No skin breakdown No calluses  Normal DP pulses Normal sensation to light touch and monofilament Nails normal       Assessment & Plan:  The patient's preventative maintenance and recommended screening tests for an annual wellness exam were reviewed in full today. Brought up to date unless services declined.  Counselled on the importance of diet, exercise, and its role in overall health and mortality. The patient's FH and SH was reviewed, including their home life, tobacco status, and drug and alcohol status.   Vaccines: uptodate, refuses flu. Refused STD testing. Nonsmoker  Prostate cancer, no 1st degree relative. Nml screen at [facility].

## 2014-10-27 NOTE — Assessment & Plan Note (Signed)
Well controlled on no med, but slightly worse than last year. Get back on track with lifestyle changes.

## 2014-10-27 NOTE — Assessment & Plan Note (Signed)
Well controlled with diet. Get back on track with lifestyle changes.

## 2014-10-27 NOTE — Progress Notes (Signed)
Pre visit review using our clinic review tool, if applicable. No additional management support is needed unless otherwise documented below in the visit note. 

## 2014-10-27 NOTE — Patient Instructions (Signed)
Get back on track with exercise and weight loss. Keep up with health eating

## 2014-10-27 NOTE — Assessment & Plan Note (Signed)
Well controlled. Continue current medication.  

## 2015-02-25 LAB — HM DIABETES EYE EXAM

## 2015-04-19 ENCOUNTER — Telehealth: Payer: Self-pay | Admitting: Family Medicine

## 2015-04-19 DIAGNOSIS — E119 Type 2 diabetes mellitus without complications: Secondary | ICD-10-CM

## 2015-04-19 DIAGNOSIS — R740 Nonspecific elevation of levels of transaminase and lactic acid dehydrogenase [LDH]: Secondary | ICD-10-CM

## 2015-04-19 DIAGNOSIS — E7800 Pure hypercholesterolemia, unspecified: Secondary | ICD-10-CM

## 2015-04-19 DIAGNOSIS — R7401 Elevation of levels of liver transaminase levels: Secondary | ICD-10-CM

## 2015-04-19 NOTE — Telephone Encounter (Signed)
-----   Message from Ellamae Sia sent at 04/16/2015  9:38 AM EDT ----- Regarding: Lab orders for Tuesdday, 5.3.16 Labs for a 6 month f/u

## 2015-04-20 ENCOUNTER — Other Ambulatory Visit (INDEPENDENT_AMBULATORY_CARE_PROVIDER_SITE_OTHER): Payer: 59

## 2015-04-20 DIAGNOSIS — R7402 Elevation of levels of lactic acid dehydrogenase (LDH): Secondary | ICD-10-CM

## 2015-04-20 DIAGNOSIS — E7800 Pure hypercholesterolemia, unspecified: Secondary | ICD-10-CM

## 2015-04-20 DIAGNOSIS — R740 Nonspecific elevation of levels of transaminase and lactic acid dehydrogenase [LDH]: Secondary | ICD-10-CM

## 2015-04-20 DIAGNOSIS — E119 Type 2 diabetes mellitus without complications: Secondary | ICD-10-CM | POA: Diagnosis not present

## 2015-04-20 LAB — COMPREHENSIVE METABOLIC PANEL
ALK PHOS: 67 U/L (ref 39–117)
ALT: 33 U/L (ref 0–53)
AST: 22 U/L (ref 0–37)
Albumin: 4.1 g/dL (ref 3.5–5.2)
BILIRUBIN TOTAL: 0.7 mg/dL (ref 0.2–1.2)
BUN: 12 mg/dL (ref 6–23)
CO2: 30 mEq/L (ref 19–32)
CREATININE: 1.16 mg/dL (ref 0.40–1.50)
Calcium: 9.6 mg/dL (ref 8.4–10.5)
Chloride: 105 mEq/L (ref 96–112)
GFR: 72.55 mL/min (ref >60.00)
Glucose, Bld: 86 mg/dL (ref 70–99)
POTASSIUM: 3.7 meq/L (ref 3.5–5.1)
Sodium: 140 mEq/L (ref 135–145)
Total Protein: 6.8 g/dL (ref 6.0–8.3)

## 2015-04-20 LAB — LIPID PANEL
CHOL/HDL RATIO: 5
Cholesterol: 120 mg/dL (ref 0–200)
HDL: 24.1 mg/dL — ABNORMAL LOW (ref >39.00)
LDL Cholesterol: 73 mg/dL (ref 0–99)
NonHDL: 95.9
TRIGLYCERIDES: 117 mg/dL (ref 0.0–149.0)
VLDL: 23.4 mg/dL (ref 0.0–40.0)

## 2015-04-20 LAB — HEMOGLOBIN A1C: Hgb A1c MFr Bld: 5 % (ref 4.6–6.5)

## 2015-04-27 ENCOUNTER — Encounter: Payer: Self-pay | Admitting: Family Medicine

## 2015-04-27 ENCOUNTER — Ambulatory Visit (INDEPENDENT_AMBULATORY_CARE_PROVIDER_SITE_OTHER): Payer: 59 | Admitting: Family Medicine

## 2015-04-27 ENCOUNTER — Ambulatory Visit (INDEPENDENT_AMBULATORY_CARE_PROVIDER_SITE_OTHER)
Admission: RE | Admit: 2015-04-27 | Discharge: 2015-04-27 | Disposition: A | Discharge status: Home / Self Care | Payer: 59 | Source: Ambulatory Visit | Attending: Family Medicine | Admitting: Family Medicine

## 2015-04-27 VITALS — BP 118/78 | HR 60 | Temp 98.3°F | Ht 70.25 in | Wt 248.5 lb

## 2015-04-27 DIAGNOSIS — L989 Disorder of the skin and subcutaneous tissue, unspecified: Secondary | ICD-10-CM

## 2015-04-27 DIAGNOSIS — E119 Type 2 diabetes mellitus without complications: Secondary | ICD-10-CM | POA: Diagnosis not present

## 2015-04-27 DIAGNOSIS — I10 Essential (primary) hypertension: Secondary | ICD-10-CM

## 2015-04-27 DIAGNOSIS — R0789 Other chest pain: Secondary | ICD-10-CM | POA: Diagnosis not present

## 2015-04-27 DIAGNOSIS — R740 Nonspecific elevation of levels of transaminase and lactic acid dehydrogenase [LDH]: Secondary | ICD-10-CM

## 2015-04-27 DIAGNOSIS — R7401 Elevation of levels of liver transaminase levels: Secondary | ICD-10-CM

## 2015-04-27 DIAGNOSIS — E7800 Pure hypercholesterolemia, unspecified: Secondary | ICD-10-CM

## 2015-04-27 LAB — HM DIABETES FOOT EXAM

## 2015-04-27 NOTE — Assessment & Plan Note (Signed)
Excellent control with lifestyle changes.

## 2015-04-27 NOTE — Assessment & Plan Note (Signed)
Well controlled. Continue current medication.  

## 2015-04-27 NOTE — Patient Instructions (Signed)
Keep up great work.  We will call with X-ray results.  Keep area clean and bandaged, use topicla antibiotic, follow up if concerns/spreading erythema/pain.

## 2015-04-27 NOTE — Assessment & Plan Note (Signed)
Seems most like muscle strain, no mass palpated, but has been present > 1 year. No improvement with ewihgt loss. No red flags such as fatigue, night sweats, etc.  Eval with CXR.

## 2015-04-27 NOTE — Progress Notes (Signed)
Pre visit review using our clinic review tool, if applicable. No additional management support is needed unless otherwise documented below in the visit note. 

## 2015-04-27 NOTE — Addendum Note (Signed)
Addended by: Emelia Salisbury C on: 04/27/2015 10:02 AM   Modules accepted: Orders

## 2015-04-27 NOTE — Addendum Note (Signed)
Addended by: [name] E on: 04/27/2015 12:29 PM   Modules accepted: Orders

## 2015-04-27 NOTE — Progress Notes (Signed)
Subjective:    Patient ID: Philip Shaffer, male    DOB: 1971/11/28, 44 y.o.   MRN: [id number]  HPI  44 year old male presents for 6 month follow up.  He reports pain in left chest wall, lower rib cage. Notes every time he stretches left chest wall with arm above head he feels it for  > 1 year.  No associated injury. No  SOB, no cough., no exertional component.  NO dizziness.  Hypertension:   Well controlled on hyzaar.  BP Readings from Last 3 Encounters:  04/27/15 118/78  10/27/14 130/84  04/24/14 120/82  Using medication without problems or lightheadedness: None Chest pain with exertion:None Edema:None Short of breath:None Average home BPs: Not checking. Other issues:   Elevated Cholesterol:  LDL now at goal < 100 on  No med! Lab Results  Component Value Date   CHOL 120 04/20/2015   HDL 24.10* 04/20/2015   LDLCALC 73 04/20/2015   LDLDIRECT 99.1 10/23/2014   TRIG 117.0 04/20/2015   CHOLHDL 5 04/20/2015  Using medications without problems: None Muscle aches: None Diet compliance: Weight watchers Exercise: 4 miles a day, walking Other complaints:   Diabetes:   Well controlled on no med. Lab Results  Component Value Date   HGBA1C 5.0 04/20/2015  Using medications without difficulties:None Hypoglycemic episodes:? Hyperglycemic episodes:? Feet problems: No issues Blood Sugars averaging: Not checking eye exam within last year: yes  LFTs now at goal with lifestyle changes.   Wt Readings from Last 3 Encounters:  04/27/15 248 lb 8 oz (112.719 kg)  10/27/14 277 lb 12 oz (125.987 kg)  04/24/14 272 lb 12 oz (123.719 kg)    Has round lesion on left lateral upper arm that he has noted increase in size in last year. No pain, no redness. Wants removed.  Review of Systems  Constitutional: Negative for fever, fatigue and unexpected weight change.  HENT: Negative for congestion, ear pain, postnasal drip, rhinorrhea, sore throat and trouble swallowing.   Eyes:  Negative for pain.  Respiratory: Negative for cough, shortness of breath and wheezing.   Cardiovascular: Negative for palpitations and leg swelling.  Gastrointestinal: Negative for nausea, abdominal pain, diarrhea, constipation and blood in stool.  Genitourinary: Negative for dysuria, urgency, hematuria, discharge, penile swelling, scrotal swelling, difficulty urinating, penile pain and testicular pain.  Skin: Negative for rash.  Neurological: Negative for syncope, weakness, light-headedness, numbness and headaches.  Psychiatric/Behavioral: Negative for behavioral problems and dysphoric mood. The patient is not nervous/anxious.        Objective:   Physical Exam  Constitutional: Vital signs are normal. He appears well-developed and well-nourished.  HENT:  Head: Normocephalic.  Right Ear: Hearing normal.  Left Ear: Hearing normal.  Nose: Nose normal.  Mouth/Throat: Oropharynx is clear and moist and mucous membranes are normal.  Neck: Trachea normal. Carotid bruit is not present. No thyroid mass and no thyromegaly present.  Cardiovascular: Normal rate, regular rhythm and normal pulses.  Exam reveals no gallop, no distant heart sounds and no friction rub.   No murmur heard. No peripheral edema  Pulmonary/Chest: Effort normal and breath sounds normal. No respiratory distress.  Skin: Skin is warm, dry and intact. Lesion noted. No rash noted.  Raised pearly lesion on left upper arm 0.5 cm  Psychiatric: He has a normal mood and affect. His speech is normal and behavior is normal. Thought content normal.    Diabetic foot exam: Normal inspection No skin breakdown No calluses  Normal DP pulses Normal  sensation to light touch and monofilament Nails normal      Assessment & Plan:  Shave biopsy  Meds, vitals, and allergies reviewed.   Indication: suspicious lesion  Location: see exam  Size: O.5 cm  Verbal informed consent obtained.  Pt aware of risks not limited to but including  infection,  bleeding, damage to near by organs.  Prep: etoh/betadine  Anesthesia: 1%lidocaine with epi, good effect  Shave made with dermablade  Minimal oozing, controlled with silver nitrate  Tolerated well  Routine postprocedure instructions d/w pt- keep area clean and bandaged, follow up if concerns/spreading erythema/pain.

## 2015-04-28 ENCOUNTER — Other Ambulatory Visit: Payer: 59

## 2015-05-06 ENCOUNTER — Other Ambulatory Visit: Payer: Self-pay | Admitting: Family Medicine

## 2015-10-27 ENCOUNTER — Telehealth: Payer: Self-pay | Admitting: Family Medicine

## 2015-10-27 DIAGNOSIS — E119 Type 2 diabetes mellitus without complications: Secondary | ICD-10-CM

## 2015-10-27 DIAGNOSIS — E7800 Pure hypercholesterolemia, unspecified: Secondary | ICD-10-CM

## 2015-10-27 NOTE — Telephone Encounter (Signed)
-----   Message from Marchia Bond sent at 10/21/2015  1:46 PM EDT ----- Regarding: 6 mo labs Thurs 11/10 need orders, thanks, :-) Please order future 6 mo f/u labs for pt's upcoming lab appt. He doesn't have an actual f/u appt scheduled only the labs. Do you want him to schedule a f/u appt while he is here?  Thanks Aniceto Boss

## 2015-10-27 NOTE — Telephone Encounter (Signed)
Actually he is due for CPX.Marland Kitchen Please have him schedule at time of labs.

## 2015-10-28 ENCOUNTER — Other Ambulatory Visit (INDEPENDENT_AMBULATORY_CARE_PROVIDER_SITE_OTHER): Payer: 59

## 2015-10-28 DIAGNOSIS — E7800 Pure hypercholesterolemia, unspecified: Secondary | ICD-10-CM

## 2015-10-28 DIAGNOSIS — E119 Type 2 diabetes mellitus without complications: Secondary | ICD-10-CM

## 2015-10-28 LAB — COMPREHENSIVE METABOLIC PANEL
ALT: 36 U/L (ref 0–53)
AST: 22 U/L (ref 0–37)
Albumin: 4.6 g/dL (ref 3.5–5.2)
Alkaline Phosphatase: 68 U/L (ref 39–117)
BILIRUBIN TOTAL: 1.1 mg/dL (ref 0.2–1.2)
BUN: 16 mg/dL (ref 6–23)
CHLORIDE: 102 meq/L (ref 96–112)
CO2: 31 meq/L (ref 19–32)
Calcium: 9.8 mg/dL (ref 8.4–10.5)
Creatinine, Ser: 1.13 mg/dL (ref 0.40–1.50)
GFR: 74.6 mL/min (ref >60.00)
GLUCOSE: 135 mg/dL — AB (ref 70–99)
POTASSIUM: 4.3 meq/L (ref 3.5–5.1)
Sodium: 139 mEq/L (ref 135–145)
Total Protein: 7.2 g/dL (ref 6.0–8.3)

## 2015-10-28 LAB — LIPID PANEL
CHOL/HDL RATIO: 6
CHOLESTEROL: 175 mg/dL (ref 0–200)
HDL: 29.9 mg/dL — AB (ref >39.00)
NONHDL: 145.4
TRIGLYCERIDES: 202 mg/dL — AB (ref 0.0–149.0)
VLDL: 40.4 mg/dL — AB (ref 0.0–40.0)

## 2015-10-28 LAB — LDL CHOLESTEROL, DIRECT: Direct LDL: 118 mg/dL

## 2015-10-28 LAB — HEMOGLOBIN A1C: Hgb A1c MFr Bld: 5.3 % (ref 4.6–6.5)

## 2015-11-03 ENCOUNTER — Other Ambulatory Visit: Payer: Self-pay | Admitting: Family Medicine

## 2015-11-15 ENCOUNTER — Encounter: Payer: Self-pay | Admitting: Family Medicine

## 2015-11-15 ENCOUNTER — Ambulatory Visit (INDEPENDENT_AMBULATORY_CARE_PROVIDER_SITE_OTHER): Payer: 59 | Admitting: Family Medicine

## 2015-11-15 VITALS — BP 104/70 | HR 74 | Temp 98.4°F | Ht 70.5 in | Wt 264.5 lb

## 2015-11-15 DIAGNOSIS — Z0000 Encounter for general adult medical examination without abnormal findings: Secondary | ICD-10-CM

## 2015-11-15 DIAGNOSIS — E119 Type 2 diabetes mellitus without complications: Secondary | ICD-10-CM

## 2015-11-15 DIAGNOSIS — I10 Essential (primary) hypertension: Secondary | ICD-10-CM

## 2015-11-15 DIAGNOSIS — E7800 Pure hypercholesterolemia, unspecified: Secondary | ICD-10-CM

## 2015-11-15 LAB — HM DIABETES FOOT EXAM

## 2015-11-15 MED ORDER — LOSARTAN POTASSIUM-HCTZ 50-12.5 MG PO TABS: 1.0000 | ORAL_TABLET | Freq: Every day | ORAL | Status: DC

## 2015-11-15 NOTE — Assessment & Plan Note (Signed)
Excellent control with diet. Encouraged exercise, weight loss, healthy eating habits.  

## 2015-11-15 NOTE — Progress Notes (Signed)
The patient is here for annual wellness exam and preventative care.   Hypertension: At goal on hyzaar BP Readings from Last 3 Encounters:  11/15/15 104/70  04/27/15 118/78  10/27/14 130/84  Using medication without problems or lightheadedness: None Chest pain with exertion:None Edema:None Short of breath:None Average home BPs:Not checking Other issues:  Elevated Cholesterol: LDL almost at goal on no med but trigs high Lab Results  Component Value Date   CHOL 175 10/28/2015   HDL 29.90* 10/28/2015   LDLCALC 73 04/20/2015   LDLDIRECT 118.0 10/28/2015   TRIG 202.0* 10/28/2015   CHOLHDL 6 10/28/2015   Diet compliance:Good Exercise: 2-3 days a week.  Wt Readings from Last 3 Encounters:  11/15/15 264 lb 8 oz (119.976 kg)  04/27/15 248 lb 8 oz (112.719 kg)  10/27/14 277 lb 12 oz (125.987 kg)   Diabetes: At goal <7 a1c on no med. Lab Results  Component Value Date   HGBA1C 5.3 10/28/2015  Hypoglycemic episodes:None Hyperglycemic episodes:None Feet problems:None Blood Sugars averaging: not checking eye exam within last year:yes  Fatty liver disease: Nml LFTs now.  Review of Systems  Constitutional: Negative for fever, fatigue and unexpected weight change.  HENT: Negative for ear pain, congestion, sore throat, rhinorrhea, trouble swallowing and postnasal drip.  Eyes: Negative for pain.  Respiratory: Negative for cough, shortness of breath and wheezing.  Cardiovascular: Negative for chest pain, palpitations and leg swelling.  Gastrointestinal: Negative for nausea, abdominal pain, diarrhea, constipation and blood in stool.  Genitourinary: Negative for dysuria, urgency, hematuria, discharge, penile swelling, scrotal swelling, difficulty urinating, penile pain and testicular pain.  Skin: Negative for rash.  Neurological: Negative for syncope, weakness, light-headedness, numbness and headaches.  Psychiatric/Behavioral: Negative for behavioral problems and  dysphoric mood. The patient is not nervous/anxious.      Objective:  Physical Exam  Constitutional: He appears well-developed and well-nourished. Non-toxic appearance. He does not appear ill. No distress.  HENT:  Head: Normocephalic and atraumatic.  Right Ear: Hearing, tympanic membrane, external ear and ear canal normal.  Left Ear: Hearing, tympanic membrane, external ear and ear canal normal.  Nose: Nose normal.  Mouth/Throat: Uvula is midline, oropharynx is clear and moist and mucous membranes are normal.  Eyes: Conjunctivae normal, EOM and lids are normal. Pupils are equal, round, and reactive to light. No foreign bodies found.  Neck: Trachea normal, normal range of motion and phonation normal. Neck supple. Carotid bruit is not present. No mass and no thyromegaly present.  Cardiovascular: Normal rate, regular rhythm, S1 normal, S2 normal, intact distal pulses and normal pulses. Exam reveals no gallop.  No murmur heard. Pulmonary/Chest: Breath sounds normal. He has no wheezes. He has no rhonchi. He has no rales.  Abdominal: Soft. Normal appearance and bowel sounds are normal. There is no hepatosplenomegaly. There is no tenderness. There is no rebound, no guarding and no CVA tenderness. No hernia. Hernia confirmed negative in the right inguinal area and confirmed negative in the left inguinal area.  Genitourinary: Testes normal and penis normal. Right testis shows no mass and no tenderness. Left testis shows no mass and no tenderness. No paraphimosis or penile tenderness.  Lymphadenopathy:   He has no cervical adenopathy.   Right: No inguinal adenopathy present.   Left: No inguinal adenopathy present.  Neurological: He is alert. He has normal strength and normal reflexes. No cranial nerve deficit or sensory deficit. Gait normal.  Skin: Skin is warm, dry and intact. No rash noted.  Psychiatric: He has a normal  mood and affect. His speech is normal and behavior  is normal. Judgment normal.    Diabetic foot exam: Normal inspection No skin breakdown No calluses  Normal DP pulses Normal sensation to light touch and monofilament Nails normal      Assessment & Plan:  The patient's preventative maintenance and recommended screening tests for an annual wellness exam were reviewed in full today. Brought up to date unless services declined.  Counselled on the importance of diet, exercise, and its role in overall health and mortality. The patient's FH and SH was reviewed, including their home life, tobacco status, and drug and alcohol status.   Vaccines: uptodate, refuses flu. Refused STD testing. Nonsmoker Prostate cancer, no 1st degree relative.  No colon cancer early. No genital concerns.

## 2015-11-15 NOTE — Progress Notes (Signed)
Pre visit review using our clinic review tool, if applicable. No additional management support is needed unless otherwise documented below in the visit note. 

## 2015-11-15 NOTE — Patient Instructions (Addendum)
Increase exercise as able to 5 days a week.  Work on low fat, low chol diet.

## 2015-11-15 NOTE — Assessment & Plan Note (Signed)
Inadequate control. Not at goal LDL , 100. Was previously at goal in last 65 month. Get back on track with low chol and low fat diet. Increase exercise and weight loss.

## 2015-11-15 NOTE — Assessment & Plan Note (Signed)
Stable on losartan HCTZ.

## 2015-12-24 ENCOUNTER — Other Ambulatory Visit: Payer: Self-pay | Admitting: Family Medicine

## 2015-12-24 MED ORDER — OMEGA-3-ACID ETHYL ESTERS 1 G PO CAPS: 2.0000 | ORAL_CAPSULE | Freq: Two times a day (BID) | ORAL | Status: DC

## 2015-12-24 MED FILL — LOSARTAN-HCTZ 50-12.5 MG TA: 50-12.5 | 90 days supply | Qty: 90 | Fill #0

## 2015-12-24 MED FILL — OMEGA-3 ETHYL ESTERS 1 GM C: 1 | 90 days supply | Qty: 360 | Fill #0

## 2015-12-24 NOTE — Telephone Encounter (Addendum)
Elizabeth from [facility] pharmacy said # 60 was 15 day rx and changed to #120 x 1 year per protocol.

## 2015-12-24 NOTE — Addendum Note (Signed)
Addended by: Helene Shoe on: 12/24/2015 02:42 PM   Modules accepted: Orders

## 2016-03-03 DIAGNOSIS — H524 Presbyopia: Secondary | ICD-10-CM | POA: Diagnosis not present

## 2016-03-29 MED FILL — OMEGA-3 ETHYL ESTERS 1 GM C: 1 | 90 days supply | Qty: 360 | Fill #1 | Status: TO

## 2016-03-29 MED FILL — LOSARTAN-HCTZ 50-12.5 MG TA: 50-12.5 | 90 days supply | Qty: 90 | Fill #1 | Status: TO

## 2016-04-05 ENCOUNTER — Ambulatory Visit (INDEPENDENT_AMBULATORY_CARE_PROVIDER_SITE_OTHER): Payer: 59 | Admitting: Family Medicine

## 2016-04-05 ENCOUNTER — Encounter: Payer: Self-pay | Admitting: Family Medicine

## 2016-04-05 VITALS — BP 130/90 | HR 88 | Temp 98.3°F | Ht 70.5 in | Wt 276.0 lb

## 2016-04-05 DIAGNOSIS — R059 Cough, unspecified: Secondary | ICD-10-CM

## 2016-04-05 DIAGNOSIS — R05 Cough: Secondary | ICD-10-CM

## 2016-04-05 MED ORDER — HYDROCOD POLST-CPM POLST ER 10-8 MG/5ML PO SUER: 5.0000 mL | Freq: Two times a day (BID) | ORAL | Status: DC | PRN

## 2016-04-05 MED FILL — HYDROCODONE-CHLORPHENIRAM S: 10-8 | 12 days supply | Qty: 120 | Fill #0

## 2016-04-05 NOTE — Progress Notes (Signed)
   Subjective:    Patient ID: Philip Shaffer, male    DOB: [birth year], 45 y.o.   MRN: [id number]  HPI  Patient is seen for acute visit with cough for the past week.  Nonsmoker. No chronic lung disease.  Cough has been dry. Minimal nasal congestion. No sore throat.  Denies any associated fevers or chills.  Cough is been severe at times- especially at night.  Interfering with sleep.  Has taken Robitussin and NyQuil without much relief.  Has also taken some Claritin without any relief.  No recent sick contacts.  Past Medical History  Diagnosis Date  . Nonspecific elevation of levels of transaminase or lactic acid dehydrogenase (LDH)    Past Surgical History  Procedure Laterality Date  . Vasectomy  2000    reports that he has never smoked. He has never used smokeless tobacco. He reports that he does not drink alcohol or use illicit drugs. family history includes Coronary artery disease in his paternal grandfather; Diabetes in his mother; Hyperlipidemia in his father; Hypertension in his father. No Known Allergies    Review of Systems  Constitutional: Negative for fever and chills.  HENT: Negative for sinus pressure and sore throat.   Respiratory: Positive for cough. Negative for shortness of breath and wheezing.   Cardiovascular: Negative for chest pain.       Objective:   Physical Exam  Constitutional: He appears well-developed and well-nourished.  HENT:  Right Ear: External ear normal.  Left Ear: External ear normal.  Mouth/Throat: Oropharynx is clear and moist.  Neck: Neck supple. No thyromegaly present.  Cardiovascular: Normal rate and regular rhythm.   Pulmonary/Chest: Effort normal and breath sounds normal. No respiratory distress. He has no wheezes. He has no rales.  Lymphadenopathy:    He has no cervical adenopathy.          Assessment & Plan:   Cough.  Suspect acute viral bronchitis. Possible allergic component. No evidence for reactive airway disease  by exam. Continue Claritin. Tussionex 1 teaspoon daily at bedtime for severe cough.  He is cautioned about potential sedation with this

## 2016-04-05 NOTE — Patient Instructions (Signed)

## 2016-04-05 NOTE — Progress Notes (Signed)
Pre visit review using our clinic review tool, if applicable. No additional management support is needed unless otherwise documented below in the visit note. 

## 2016-04-18 ENCOUNTER — Encounter: Payer: Self-pay | Admitting: Family Medicine

## 2016-04-18 ENCOUNTER — Ambulatory Visit (INDEPENDENT_AMBULATORY_CARE_PROVIDER_SITE_OTHER): Payer: 59 | Admitting: Family Medicine

## 2016-04-18 VITALS — BP 140/90 | HR 73 | Temp 97.5°F | Ht 70.5 in | Wt 276.8 lb

## 2016-04-18 DIAGNOSIS — M542 Cervicalgia: Secondary | ICD-10-CM | POA: Diagnosis not present

## 2016-04-18 MED ORDER — DICLOFENAC SODIUM 75 MG PO TBEC: 75.0000 mg | DELAYED_RELEASE_TABLET | Freq: Two times a day (BID) | ORAL | Status: DC

## 2016-04-18 MED ORDER — CYCLOBENZAPRINE HCL 10 MG PO TABS: 10.0000 mg | ORAL_TABLET | Freq: Three times a day (TID) | ORAL | Status: DC | PRN

## 2016-04-18 MED FILL — CYCLOBENZAPRINE 10 MG TAB: 10 | 10 days supply | Qty: 30 | Fill #0

## 2016-04-18 MED FILL — DICLOFENAC SOD EC 75 MG TAB: 75 | 30 days supply | Qty: 60 | Fill #0

## 2016-04-18 NOTE — Progress Notes (Signed)
Pre visit review using our clinic review tool, if applicable. No additional management support is needed unless otherwise documented below in the visit note. 

## 2016-04-18 NOTE — Progress Notes (Signed)
Dr. Frederico Hamman T. Elainna Eshleman, MD, [facility] [place], [address] Phone: [phone] Fax: [phone]  04/18/2016  Patient: Philip Shaffer, MRN: [id number], DOB: [birth year], 45 y.o.  Primary Physician:  [name], MD   Chief Complaint  Patient presents with  . Neck Pain    From coughing    Subjective:   Philip Shaffer is a 45 y.o. very pleasant male patient who presents with the following:  Cough cleared up a few days ago - prior to Tussionex, fairly severe. Started to take some Claritin.   He had been coughing all the time, and now he has some focal pain on the posterior paracervical musculature on the left.  This didn't occur prior to his extreme coughing.  He is fairly uncomfortable, he's been having now for about a week or so, and he is having difficulty sleeping at nighttime.  Past Medical History, Surgical History, Social History, Family History, Problem List, Medications, and Allergies have been reviewed and updated if relevant.  Patient Active Problem List   Diagnosis Date Noted  . High cholesterol 10/19/2011  . FATTY LIVER DISEASE 03/31/2009  . ESSENTIAL HYPERTENSION, BENIGN 03/24/2009  . Diabetes mellitus with no complication ([place]) [id number]  . COMMON MIGRAINE 09/06/2007    Past Medical History  Diagnosis Date  . Nonspecific elevation of levels of transaminase or lactic acid dehydrogenase (LDH)     Past Surgical History  Procedure Laterality Date  . Vasectomy  2000    Social History   Social History  . Marital Status: Married    Spouse Name: N/A  . Number of Children: N/A  . Years of Education: N/A   Occupational History  . business Freight forwarder    Social History Main Topics  . Smoking status: Never Smoker   . Smokeless tobacco: Never Used  . Alcohol Use: No  . Drug Use: No  . Sexual Activity: Not on file   Other Topics Concern  . Not on file   Social History Narrative   Regular exercise-yes-plays basketball, coach baseball   Diet; no fruit, but some veggies, rare water, drinks a lot of caffeine in soda and sweet tes    Family History  Problem Relation Age of Onset  . Diabetes Mother   . Hyperlipidemia Father   . Hypertension Father   . Coronary artery disease Paternal Grandfather     No Known Allergies  Medication list reviewed and updated in full in [place].   GEN: No acute illnesses, no fevers, chills. GI: No n/v/d, eating normally Pulm: No SOB Interactive and getting along well at home.  Otherwise, ROS is as per the HPI.  Objective:   BP 140/90 mmHg  Pulse 73  Temp(Src) 97.5 F (36.4 C) (Oral)  Ht 5' 10.5" (1.791 m)  Wt 276 lb 12 oz (125.533 kg)  BMI 39.14 kg/m2   GEN: Well-developed,well-nourished,in no acute distress; alert,appropriate and cooperative throughout examination HEENT: Normocephalic and atraumatic without obvious abnormalities. Ears, externally no deformities PULM: Breathing comfortably in no respiratory distress EXT: No clubbing, cyanosis, or edema PSYCH: Normally interactive. Cooperative during the interview. Pleasant. Friendly and conversant. Not anxious or depressed appearing. Normal, full affect.  CERVICAL SPINE EXAM Range of motion: Flexion, extension, lateral bending, and rotation: modest restriction, lateral bending Pain with terminal motion: y, flexion, r rotation Spinous Processes: NT SCM: NT Upper paracervical muscles: ttp on the L Upper traps: NT C5-T1 intact, sensation and motor  Laboratory and Imaging Data:  Assessment and Plan:   Cervicalgia  Left-sided paracervical neck spasm versus strain.  Suspect that this will get better within the next 1-2 weeks.  Range of motion exercises given.  Follow-up: No Follow-up on file.  New Prescriptions   CYCLOBENZAPRINE (FLEXERIL) 10 MG TABLET    Take 1 tablet (10 mg total) by mouth 3 (three) times daily as needed for muscle spasms.    DICLOFENAC (VOLTAREN) 75 MG EC TABLET    Take 1 tablet (75 mg total) by mouth 2 (two) times daily.   Signed,  Maud Deed. Helios Kohlmann, MD   Patient's Medications  New Prescriptions   CYCLOBENZAPRINE (FLEXERIL) 10 MG TABLET    Take 1 tablet (10 mg total) by mouth 3 (three) times daily as needed for muscle spasms.   DICLOFENAC (VOLTAREN) 75 MG EC TABLET    Take 1 tablet (75 mg total) by mouth 2 (two) times daily.  Previous Medications   LORATADINE (CLARITIN) 10 MG TABLET    Take 10 mg by mouth daily.   LOSARTAN-HYDROCHLOROTHIAZIDE (HYZAAR) 50-12.5 MG TABLET    Take 1 tablet by mouth daily.   OMEGA-3 ACID ETHYL ESTERS (LOVAZA) 1 G CAPSULE    Take 2 capsules (2 g total) by mouth 2 (two) times daily.  Modified Medications   No medications on file  Discontinued Medications   CHLORPHENIRAMINE-HYDROCODONE (TUSSIONEX PENNKINETIC ER) 10-8 MG/5ML SUER    Take 5 mLs by mouth every 12 (twelve) hours as needed for cough.

## 2016-05-09 ENCOUNTER — Telehealth: Payer: Self-pay | Admitting: Family Medicine

## 2016-05-09 DIAGNOSIS — E119 Type 2 diabetes mellitus without complications: Secondary | ICD-10-CM

## 2016-05-09 DIAGNOSIS — E7800 Pure hypercholesterolemia, unspecified: Secondary | ICD-10-CM

## 2016-05-09 NOTE — Telephone Encounter (Signed)
-----   Message from Ellamae Sia sent at 05/01/2016  3:53 PM EDT ----- Regarding: Lab orders for Thursday, 5.25.17 Lab orders for a 6 month follow up appt

## 2016-05-11 ENCOUNTER — Other Ambulatory Visit (INDEPENDENT_AMBULATORY_CARE_PROVIDER_SITE_OTHER): Payer: 59

## 2016-05-11 DIAGNOSIS — E119 Type 2 diabetes mellitus without complications: Secondary | ICD-10-CM | POA: Diagnosis not present

## 2016-05-11 DIAGNOSIS — E7800 Pure hypercholesterolemia, unspecified: Secondary | ICD-10-CM

## 2016-05-11 LAB — HEMOGLOBIN A1C: Hgb A1c MFr Bld: 6.2 % (ref 4.6–6.5)

## 2016-05-11 LAB — COMPREHENSIVE METABOLIC PANEL
ALT: 69 U/L — AB (ref 0–53)
AST: 32 U/L (ref 0–37)
Albumin: 4.3 g/dL (ref 3.5–5.2)
Alkaline Phosphatase: 96 U/L (ref 39–117)
BILIRUBIN TOTAL: 0.7 mg/dL (ref 0.2–1.2)
BUN: 14 mg/dL (ref 6–23)
CALCIUM: 9 mg/dL (ref 8.4–10.5)
CHLORIDE: 105 meq/L (ref 96–112)
CO2: 29 meq/L (ref 19–32)
Creatinine, Ser: 0.93 mg/dL (ref 0.40–1.50)
GFR: 93.18 mL/min (ref >60.00)
GLUCOSE: 154 mg/dL — AB (ref 70–99)
Potassium: 3.9 mEq/L (ref 3.5–5.1)
Sodium: 139 mEq/L (ref 135–145)
Total Protein: 6.7 g/dL (ref 6.0–8.3)

## 2016-05-11 LAB — LIPID PANEL
CHOL/HDL RATIO: 7
Cholesterol: 169 mg/dL (ref 0–200)
HDL: 24.7 mg/dL — AB (ref >39.00)
NONHDL: 144.64
Triglycerides: 245 mg/dL — ABNORMAL HIGH (ref 0.0–149.0)
VLDL: 49 mg/dL — AB (ref 0.0–40.0)

## 2016-05-11 LAB — LDL CHOLESTEROL, DIRECT: LDL DIRECT: 103 mg/dL

## 2016-05-16 ENCOUNTER — Encounter: Payer: Self-pay | Admitting: *Deleted

## 2016-05-16 ENCOUNTER — Ambulatory Visit: Payer: 59 | Admitting: Family Medicine

## 2016-05-23 DIAGNOSIS — G4489 Other headache syndrome: Secondary | ICD-10-CM | POA: Diagnosis not present

## 2016-05-23 DIAGNOSIS — M5382 Other specified dorsopathies, cervical region: Secondary | ICD-10-CM | POA: Diagnosis not present

## 2016-05-23 DIAGNOSIS — M9901 Segmental and somatic dysfunction of cervical region: Secondary | ICD-10-CM | POA: Diagnosis not present

## 2016-05-23 DIAGNOSIS — M9905 Segmental and somatic dysfunction of pelvic region: Secondary | ICD-10-CM | POA: Diagnosis not present

## 2016-05-23 DIAGNOSIS — M9903 Segmental and somatic dysfunction of lumbar region: Secondary | ICD-10-CM | POA: Diagnosis not present

## 2016-05-23 DIAGNOSIS — M791 Myalgia: Secondary | ICD-10-CM | POA: Diagnosis not present

## 2016-05-23 DIAGNOSIS — M9902 Segmental and somatic dysfunction of thoracic region: Secondary | ICD-10-CM | POA: Diagnosis not present

## 2016-06-22 DIAGNOSIS — M9905 Segmental and somatic dysfunction of pelvic region: Secondary | ICD-10-CM | POA: Diagnosis not present

## 2016-06-22 DIAGNOSIS — M791 Myalgia: Secondary | ICD-10-CM | POA: Diagnosis not present

## 2016-06-22 DIAGNOSIS — M9903 Segmental and somatic dysfunction of lumbar region: Secondary | ICD-10-CM | POA: Diagnosis not present

## 2016-06-22 DIAGNOSIS — M9902 Segmental and somatic dysfunction of thoracic region: Secondary | ICD-10-CM | POA: Diagnosis not present

## 2016-06-22 DIAGNOSIS — G4489 Other headache syndrome: Secondary | ICD-10-CM | POA: Diagnosis not present

## 2016-06-22 DIAGNOSIS — M5382 Other specified dorsopathies, cervical region: Secondary | ICD-10-CM | POA: Diagnosis not present

## 2016-06-22 DIAGNOSIS — M9901 Segmental and somatic dysfunction of cervical region: Secondary | ICD-10-CM | POA: Diagnosis not present

## 2016-07-20 MED FILL — LOSARTAN-HCTZ 50-12.5 MG TA: 50-12.5 | 90 days supply | Qty: 90 | Fill #0

## 2016-07-20 MED FILL — OMEGA-3 ETHYL ESTERS 1 GM C: 1 | 90 days supply | Qty: 360 | Fill #0

## 2016-07-25 DIAGNOSIS — M9903 Segmental and somatic dysfunction of lumbar region: Secondary | ICD-10-CM | POA: Diagnosis not present

## 2016-07-25 DIAGNOSIS — M791 Myalgia: Secondary | ICD-10-CM | POA: Diagnosis not present

## 2016-07-25 DIAGNOSIS — M9901 Segmental and somatic dysfunction of cervical region: Secondary | ICD-10-CM | POA: Diagnosis not present

## 2016-07-25 DIAGNOSIS — M9905 Segmental and somatic dysfunction of pelvic region: Secondary | ICD-10-CM | POA: Diagnosis not present

## 2016-07-25 DIAGNOSIS — M5382 Other specified dorsopathies, cervical region: Secondary | ICD-10-CM | POA: Diagnosis not present

## 2016-07-25 DIAGNOSIS — M9902 Segmental and somatic dysfunction of thoracic region: Secondary | ICD-10-CM | POA: Diagnosis not present

## 2016-07-25 DIAGNOSIS — G4489 Other headache syndrome: Secondary | ICD-10-CM | POA: Diagnosis not present

## 2016-10-16 IMAGING — CR DG CHEST 2V
2 series · 2 of 2 positions shown · non-contrast
Comparison: None.

CLINICAL DATA: Chest wall pain.  Initial evaluation .

EXAM:
CHEST  2 VIEW

[view not recorded (1 of 2)]
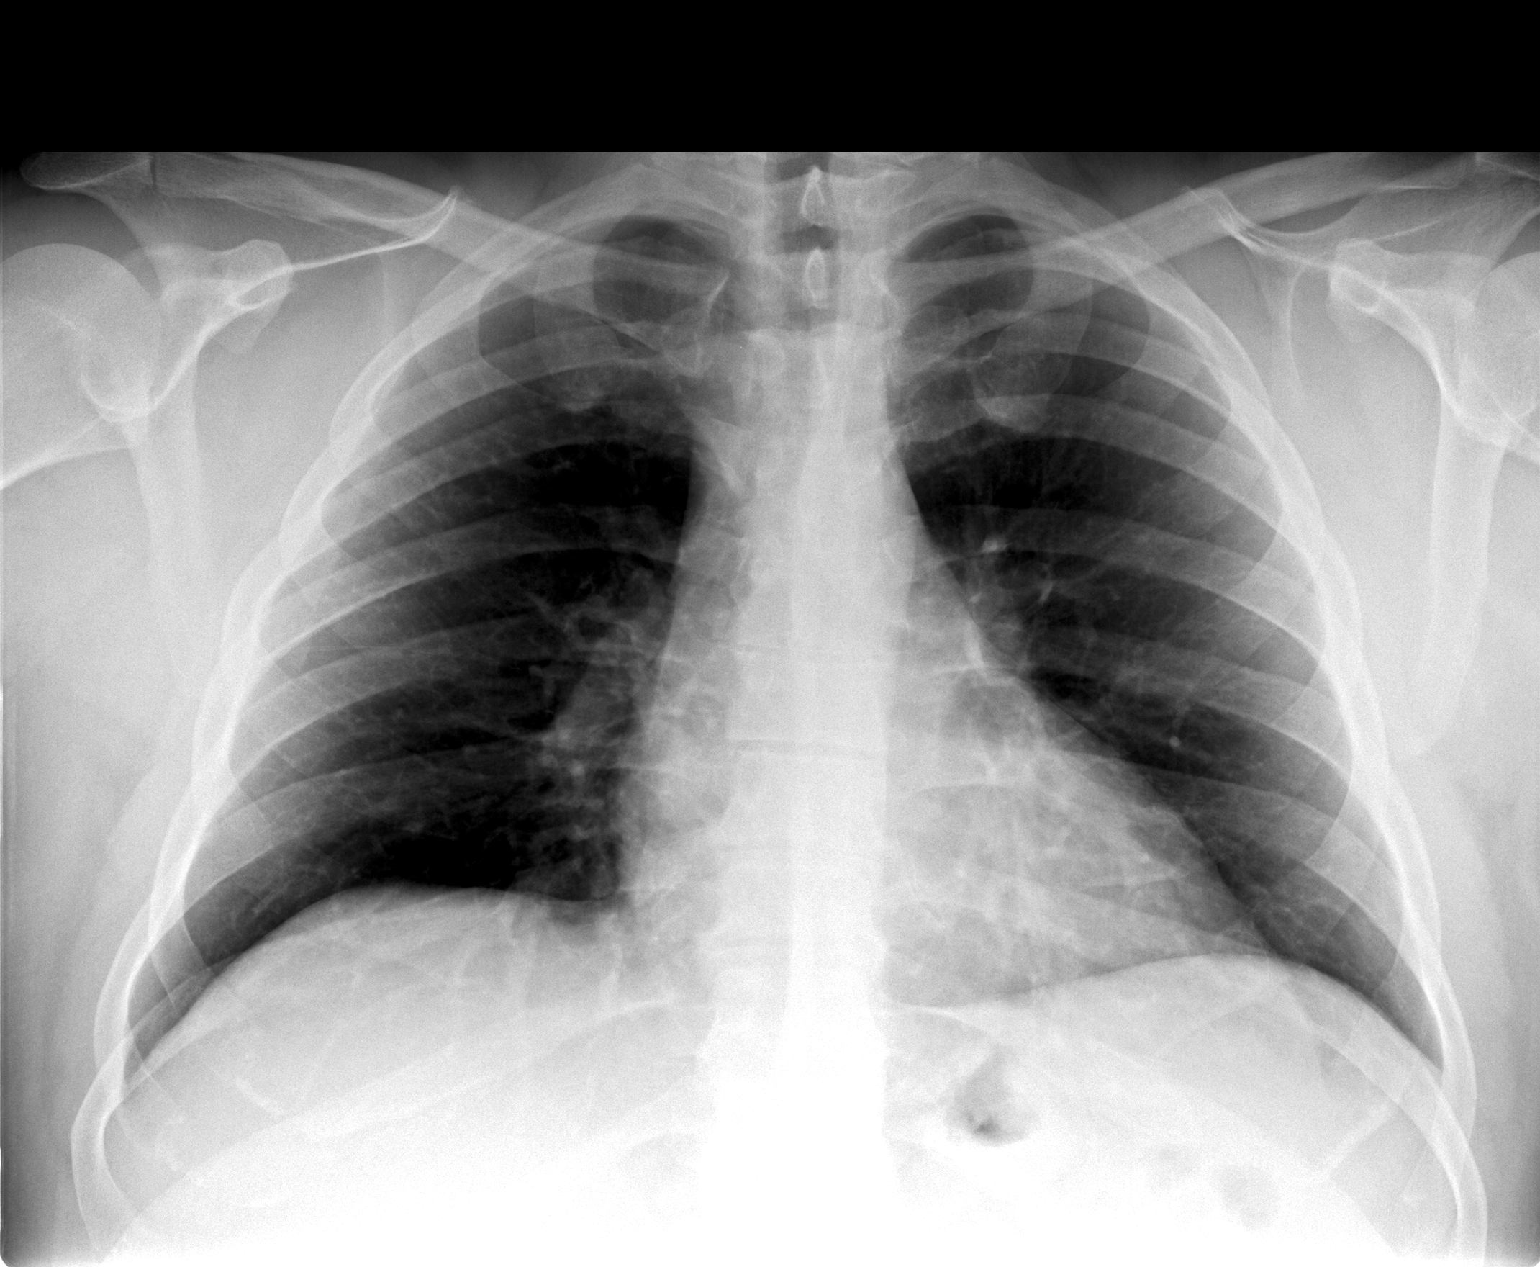

[view not recorded (2 of 2)]
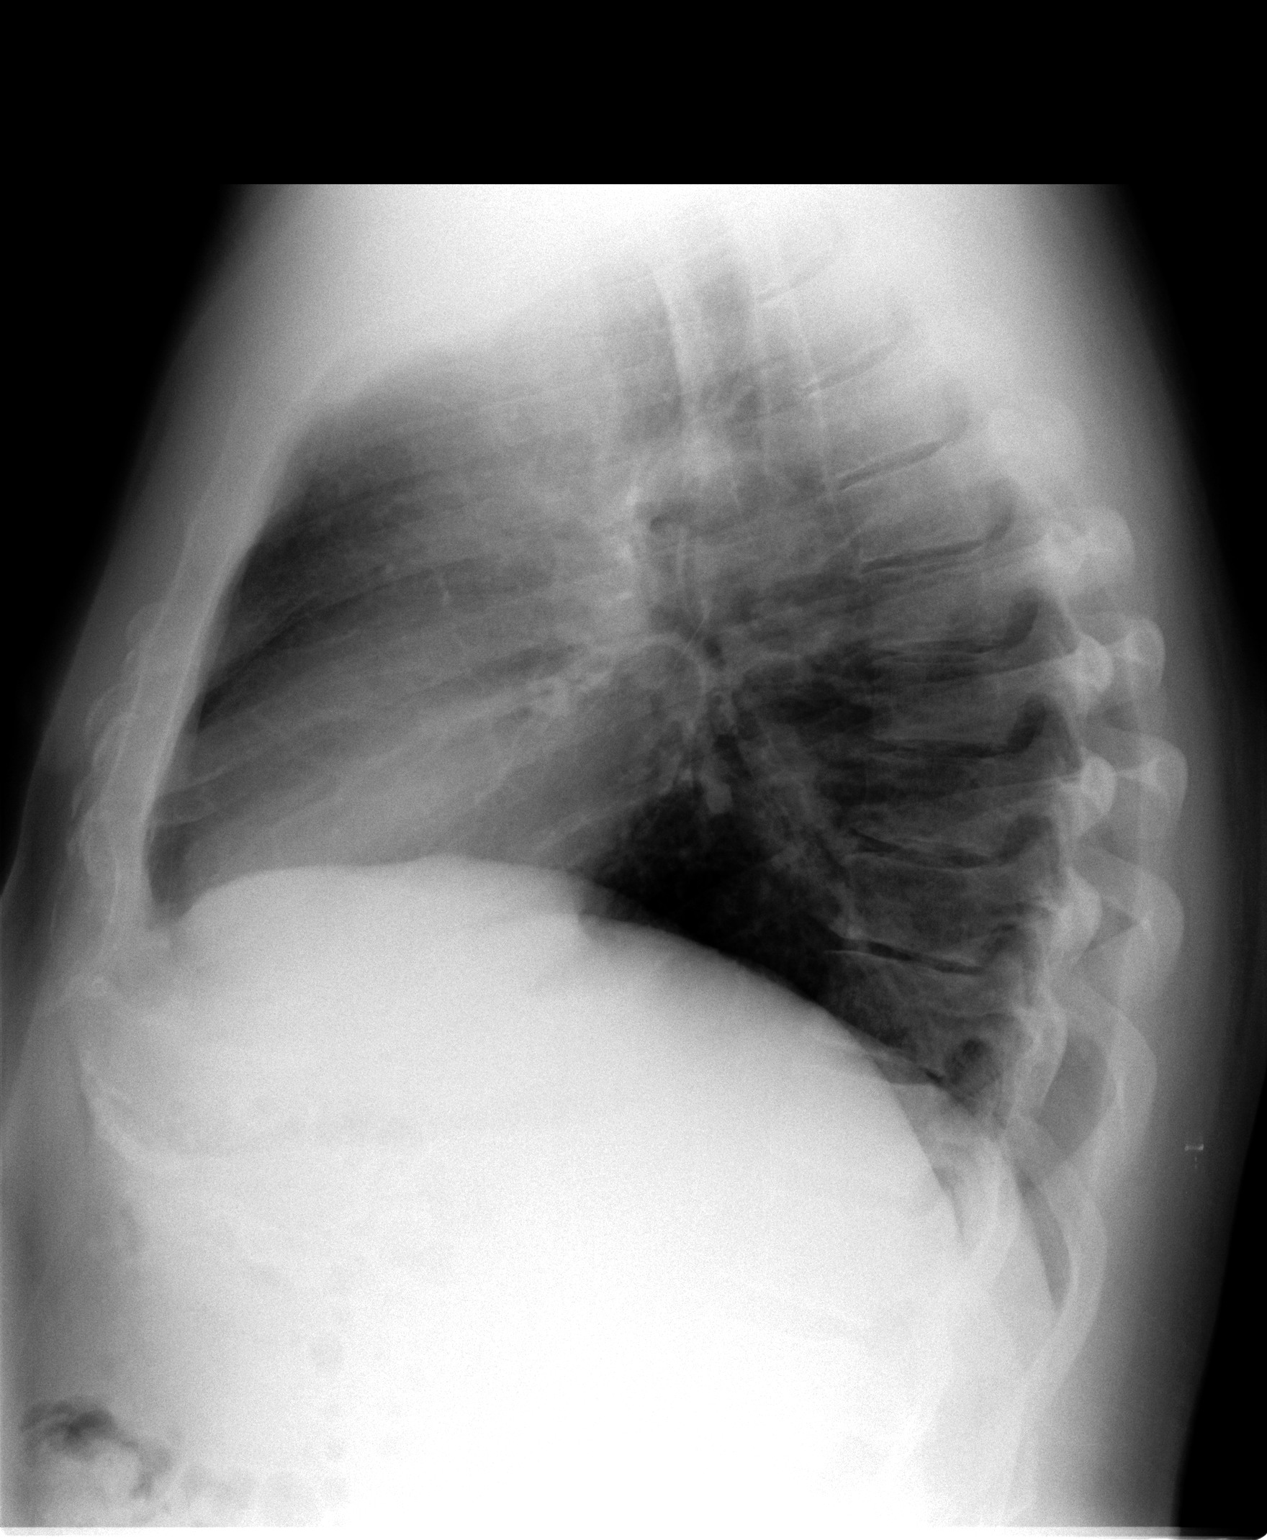

[2 of 2 positions shown; findings below may reference images not displayed]

FINDINGS: Mediastinum hilar structures are normal. Low lung volumes with mild
bibasilar subsegmental atelectasis. Cardiomegaly. No pleural
effusion or pneumothorax. No acute bony abnormality.
IMPRESSION: Low lung volumes with mild bibasilar subsegmental atelectasis .

## 2016-11-01 ENCOUNTER — Other Ambulatory Visit: Payer: Self-pay | Admitting: Family Medicine

## 2016-11-01 MED FILL — LOSARTAN-HCTZ 50-12.5 MG TA: 50-12.5 | 90 days supply | Qty: 90 | Fill #0

## 2016-11-01 MED FILL — OMEGA-3 ETHYL ESTERS 1 GM C: 1 | 90 days supply | Qty: 360 | Fill #1

## 2016-11-01 NOTE — Telephone Encounter (Signed)
My-Chart letter sent to patient asking him to call the office and schedule his Annual Physical.

## 2017-02-09 ENCOUNTER — Other Ambulatory Visit: Payer: Self-pay | Admitting: Family Medicine

## 2017-02-09 MED FILL — LOSARTAN-HCTZ 50-12.5 MG TA: 50-12.5 | 15 days supply | Qty: 15 | Fill #0

## 2017-02-12 ENCOUNTER — Other Ambulatory Visit: Payer: Self-pay | Admitting: Family Medicine

## 2017-02-12 MED FILL — OMEGA-3 ETHYL ESTERS 1 GM C: 1 | 30 days supply | Qty: 120 | Fill #0

## 2017-03-06 ENCOUNTER — Ambulatory Visit (INDEPENDENT_AMBULATORY_CARE_PROVIDER_SITE_OTHER): Payer: 59 | Admitting: Family Medicine

## 2017-03-06 ENCOUNTER — Encounter: Payer: Self-pay | Admitting: Family Medicine

## 2017-03-06 VITALS — BP 130/92 | HR 68 | Temp 97.7°F | Ht 70.5 in | Wt 275.5 lb

## 2017-03-06 DIAGNOSIS — L989 Disorder of the skin and subcutaneous tissue, unspecified: Secondary | ICD-10-CM

## 2017-03-06 DIAGNOSIS — K760 Fatty (change of) liver, not elsewhere classified: Secondary | ICD-10-CM

## 2017-03-06 DIAGNOSIS — E6609 Other obesity due to excess calories: Secondary | ICD-10-CM | POA: Diagnosis not present

## 2017-03-06 DIAGNOSIS — I10 Essential (primary) hypertension: Secondary | ICD-10-CM | POA: Diagnosis not present

## 2017-03-06 DIAGNOSIS — IMO0001 Reserved for inherently not codable concepts without codable children: Secondary | ICD-10-CM

## 2017-03-06 DIAGNOSIS — E119 Type 2 diabetes mellitus without complications: Secondary | ICD-10-CM

## 2017-03-06 DIAGNOSIS — Z6838 Body mass index (BMI) 38.0-38.9, adult: Secondary | ICD-10-CM

## 2017-03-06 DIAGNOSIS — Z0000 Encounter for general adult medical examination without abnormal findings: Secondary | ICD-10-CM | POA: Diagnosis not present

## 2017-03-06 DIAGNOSIS — E7800 Pure hypercholesterolemia, unspecified: Secondary | ICD-10-CM

## 2017-03-06 LAB — HM DIABETES FOOT EXAM

## 2017-03-06 LAB — COMPREHENSIVE METABOLIC PANEL
ALBUMIN: 4.5 g/dL (ref 3.5–5.2)
ALK PHOS: 81 U/L (ref 39–117)
ALT: 77 U/L — AB (ref 0–53)
AST: 39 U/L — ABNORMAL HIGH (ref 0–37)
BILIRUBIN TOTAL: 0.8 mg/dL (ref 0.2–1.2)
BUN: 13 mg/dL (ref 6–23)
CO2: 29 mEq/L (ref 19–32)
Calcium: 9.7 mg/dL (ref 8.4–10.5)
Chloride: 100 mEq/L (ref 96–112)
Creatinine, Ser: 0.86 mg/dL (ref 0.40–1.50)
GFR: 101.62 mL/min (ref >60.00)
GLUCOSE: 224 mg/dL — AB (ref 70–99)
Potassium: 3.9 mEq/L (ref 3.5–5.1)
SODIUM: 137 meq/L (ref 135–145)
TOTAL PROTEIN: 7 g/dL (ref 6.0–8.3)

## 2017-03-06 LAB — LIPID PANEL
Cholesterol: 167 mg/dL (ref 0–200)
HDL: 25.8 mg/dL — ABNORMAL LOW (ref >39.00)
NonHDL: 141.34
TRIGLYCERIDES: 290 mg/dL — AB (ref 0.0–149.0)
Total CHOL/HDL Ratio: 6
VLDL: 58 mg/dL — ABNORMAL HIGH (ref 0.0–40.0)

## 2017-03-06 LAB — HEMOGLOBIN A1C: HEMOGLOBIN A1C: 8 % — AB (ref 4.6–6.5)

## 2017-03-06 LAB — LDL CHOLESTEROL, DIRECT: LDL DIRECT: 93 mg/dL

## 2017-03-06 MED ORDER — LOSARTAN POTASSIUM-HCTZ 50-12.5 MG PO TABS: 1.0000 | ORAL_TABLET | Freq: Every day | ORAL | 3 refills | Status: DC

## 2017-03-06 MED ORDER — OMEGA-3-ACID ETHYL ESTERS 1 G PO CAPS: 2.0000 | ORAL_CAPSULE | Freq: Two times a day (BID) | ORAL | 3 refills | Status: DC

## 2017-03-06 MED FILL — LOSARTAN-HCTZ 50-12.5 MG TA: 50-12.5 | 90 days supply | Qty: 90 | Fill #0

## 2017-03-06 NOTE — Assessment & Plan Note (Signed)
Due for re-eval. 

## 2017-03-06 NOTE — Progress Notes (Signed)
   Subjective:    Patient ID: Philip Shaffer, male    DOB: 1971/05/29, 46 y.o.   MRN: [id number]  HPI  46 year old male presents for follow up DM, HTN.   Hypertension:   On losartan HCTZ 50/12.5 mg daily  running late today.  BP Readings from Last 3 Encounters:  03/06/17 (!) 130/92  04/18/16 140/90  04/05/16 130/90  Using medication without problems or lightheadedness:  none Chest pain with exertion: none Edema:none Short of breath: none Average home BPs: not checking Other issues:  Diabetes:   Due for re-eval. Lab Results  Component Value Date   HGBA1C 6.2 05/11/2016  Not checking. Feet problems: none Blood Sugars averaging: none eye exam within last year: schedule  Due for re-eval of cholesterol and CMET on ACEI and with history of fatty liver.  Obesity: Body mass index is 38.97 kg/m. Exercise: none  Diet: moderate  Review of Systems     Objective:   Physical Exam  Constitutional: Vital signs are normal. He appears well-developed and well-nourished.  HENT:  Head: Normocephalic.  Right Ear: Hearing normal.  Left Ear: Hearing normal.  Nose: Nose normal.  Mouth/Throat: Oropharynx is clear and moist and mucous membranes are normal.  Neck: Trachea normal. Carotid bruit is not present. No thyroid mass and no thyromegaly present.  Cardiovascular: Normal rate, regular rhythm and normal pulses.  Exam reveals no gallop, no distant heart sounds and no friction rub.   No murmur heard. No peripheral edema  Pulmonary/Chest: Effort normal and breath sounds normal. No respiratory distress.  Skin: Skin is warm, dry and intact. No rash noted.  0.2 cm irregualr skin colored raised lesion on chest, slightly red.  Psychiatric: He has a normal mood and affect. His speech is normal and behavior is normal. Thought content normal.          Assessment & Plan:   The patient's preventative maintenance and recommended screening tests for an annual wellness exam were  reviewed in full today. Brought up to date unless services declined.  Counselled on the importance of diet, exercise, and its role in overall health and mortality. The patient's FH and SH was reviewed, including their home life, tobacco status, and drug and alcohol status.    ETOH minimal  nonsmoker.  no early colon cancer or prostate cancer  refused HIV. Refused vaccines

## 2017-03-06 NOTE — Assessment & Plan Note (Signed)
Due for re-eval on no med.

## 2017-03-06 NOTE — Assessment & Plan Note (Signed)
Borderline control but pt rushed to office today. Follow at home.. If > 140/90 pt to call.

## 2017-03-06 NOTE — Assessment & Plan Note (Signed)
Pt counseled on weight loss as a way to decrease CVD risk and to improve comorbidities.

## 2017-03-06 NOTE — Patient Instructions (Addendum)
Work on increase in exercise low fat and low carb diet. Work on weight loss. Keep appt for yearly eye exam.  Follow blood pressure at home.. Call if > 140/90.

## 2017-03-06 NOTE — Assessment & Plan Note (Signed)
No worrisome features. Likely inflammatory lesion versus irritated mole. Treat with topical steroid.. Consider bx if worrisome features occur.

## 2017-03-06 NOTE — Progress Notes (Signed)
Pre visit review using our clinic review tool, if applicable. No additional management support is needed unless otherwise documented below in the visit note. 

## 2017-03-09 ENCOUNTER — Encounter: Payer: Self-pay | Admitting: *Deleted

## 2017-03-22 DIAGNOSIS — H524 Presbyopia: Secondary | ICD-10-CM | POA: Diagnosis not present

## 2017-03-22 DIAGNOSIS — H52223 Regular astigmatism, bilateral: Secondary | ICD-10-CM | POA: Diagnosis not present

## 2017-03-22 DIAGNOSIS — H5201 Hypermetropia, right eye: Secondary | ICD-10-CM | POA: Diagnosis not present

## 2017-03-22 DIAGNOSIS — H5212 Myopia, left eye: Secondary | ICD-10-CM | POA: Diagnosis not present

## 2017-03-22 LAB — HM DIABETES EYE EXAM

## 2017-03-23 ENCOUNTER — Encounter: Payer: Self-pay | Admitting: Family Medicine

## 2017-04-13 DIAGNOSIS — M791 Myalgia: Secondary | ICD-10-CM | POA: Diagnosis not present

## 2017-04-13 DIAGNOSIS — M9903 Segmental and somatic dysfunction of lumbar region: Secondary | ICD-10-CM | POA: Diagnosis not present

## 2017-04-13 DIAGNOSIS — M9905 Segmental and somatic dysfunction of pelvic region: Secondary | ICD-10-CM | POA: Diagnosis not present

## 2017-04-13 DIAGNOSIS — M5408 Panniculitis affecting regions of neck and back, sacral and sacrococcygeal region: Secondary | ICD-10-CM | POA: Diagnosis not present

## 2017-04-13 DIAGNOSIS — M62838 Other muscle spasm: Secondary | ICD-10-CM | POA: Diagnosis not present

## 2017-05-30 DIAGNOSIS — M5408 Panniculitis affecting regions of neck and back, sacral and sacrococcygeal region: Secondary | ICD-10-CM | POA: Diagnosis not present

## 2017-05-30 DIAGNOSIS — M9903 Segmental and somatic dysfunction of lumbar region: Secondary | ICD-10-CM | POA: Diagnosis not present

## 2017-05-30 DIAGNOSIS — M62838 Other muscle spasm: Secondary | ICD-10-CM | POA: Diagnosis not present

## 2017-05-30 DIAGNOSIS — M9905 Segmental and somatic dysfunction of pelvic region: Secondary | ICD-10-CM | POA: Diagnosis not present

## 2017-05-30 DIAGNOSIS — M791 Myalgia: Secondary | ICD-10-CM | POA: Diagnosis not present

## 2017-06-29 MED FILL — LOSARTAN POTASSIUM-HCTZ 50-: 50-12.5 | 90 days supply | Qty: 90 | Fill #1

## 2017-06-29 MED FILL — OMEGA-3 ETHYL ESTER 1 GM CA: 1 | 90 days supply | Qty: 360 | Fill #0

## 2017-08-01 DIAGNOSIS — M9905 Segmental and somatic dysfunction of pelvic region: Secondary | ICD-10-CM | POA: Diagnosis not present

## 2017-08-01 DIAGNOSIS — M5385 Other specified dorsopathies, thoracolumbar region: Secondary | ICD-10-CM | POA: Diagnosis not present

## 2017-08-01 DIAGNOSIS — M9903 Segmental and somatic dysfunction of lumbar region: Secondary | ICD-10-CM | POA: Diagnosis not present

## 2017-08-01 DIAGNOSIS — M9902 Segmental and somatic dysfunction of thoracic region: Secondary | ICD-10-CM | POA: Diagnosis not present

## 2017-08-01 DIAGNOSIS — M62838 Other muscle spasm: Secondary | ICD-10-CM | POA: Diagnosis not present

## 2017-10-22 MED FILL — LOSARTAN-HCTZ 50-12.5 MG TA: 50-12.5 | 90 days supply | Qty: 90 | Fill #2

## 2018-02-01 DIAGNOSIS — M2560 Stiffness of unspecified joint, not elsewhere classified: Secondary | ICD-10-CM | POA: Diagnosis not present

## 2018-02-01 DIAGNOSIS — M9901 Segmental and somatic dysfunction of cervical region: Secondary | ICD-10-CM | POA: Diagnosis not present

## 2018-02-14 MED FILL — LOSARTAN-HCTZ 50-12.5 MG TA: 50-12.5 | 90 days supply | Qty: 90 | Fill #3

## 2018-02-18 MED FILL — OMEGA-3 ETHYL ESTERS 1 GM C: 1 | 90 days supply | Qty: 360 | Fill #1

## 2018-04-12 DIAGNOSIS — M9903 Segmental and somatic dysfunction of lumbar region: Secondary | ICD-10-CM | POA: Diagnosis not present

## 2018-04-12 DIAGNOSIS — M2560 Stiffness of unspecified joint, not elsewhere classified: Secondary | ICD-10-CM | POA: Diagnosis not present

## 2018-04-12 DIAGNOSIS — M9905 Segmental and somatic dysfunction of pelvic region: Secondary | ICD-10-CM | POA: Diagnosis not present

## 2018-06-11 ENCOUNTER — Other Ambulatory Visit: Payer: Self-pay | Admitting: Family Medicine

## 2018-06-11 MED FILL — LOSARTAN-HCTZ 50-12.5 MG TA: 50-12.5 | 30 days supply | Qty: 30 | Fill #0

## 2018-06-11 MED FILL — OMEGA-3 ETHYL ESTERS 1 GM C: 1 | 30 days supply | Qty: 120 | Fill #0

## 2018-06-12 MED ORDER — OMEGA-3-ACID ETHYL ESTERS 1 G PO CAPS: 2.0000 | ORAL_CAPSULE | Freq: Two times a day (BID) | ORAL | 1 refills | Status: DC

## 2018-06-12 MED ORDER — LOSARTAN POTASSIUM-HCTZ 50-12.5 MG PO TABS: 1.0000 | ORAL_TABLET | Freq: Every day | ORAL | 1 refills | Status: DC

## 2018-06-12 NOTE — Addendum Note (Signed)
Addended by: Helene Shoe on: 06/12/2018 10:16 AM   Modules accepted: Orders

## 2018-06-12 NOTE — Telephone Encounter (Signed)
Patient scheduled a physical appointment at Dr. Diona Browner first available on 08/22/18 as instructed. Patient is requesting enough medication to get him to his appointment.

## 2018-06-12 NOTE — Telephone Encounter (Signed)
pt last seen 02/2017 and per protocol refill until CPX to [facility].

## 2018-07-17 MED FILL — LOSARTAN-HCTZ 50-12.5 MG TA: 50-12.5 | 30 days supply | Qty: 30 | Fill #0

## 2018-08-01 DIAGNOSIS — M9901 Segmental and somatic dysfunction of cervical region: Secondary | ICD-10-CM | POA: Diagnosis not present

## 2018-08-01 DIAGNOSIS — G4489 Other headache syndrome: Secondary | ICD-10-CM | POA: Diagnosis not present

## 2018-08-01 DIAGNOSIS — M2560 Stiffness of unspecified joint, not elsewhere classified: Secondary | ICD-10-CM | POA: Diagnosis not present

## 2018-08-06 DIAGNOSIS — H524 Presbyopia: Secondary | ICD-10-CM | POA: Diagnosis not present

## 2018-08-06 LAB — HM DIABETES EYE EXAM

## 2018-08-07 ENCOUNTER — Encounter: Payer: Self-pay | Admitting: Family Medicine

## 2018-08-14 ENCOUNTER — Telehealth: Payer: Self-pay | Admitting: Family Medicine

## 2018-08-14 DIAGNOSIS — E119 Type 2 diabetes mellitus without complications: Secondary | ICD-10-CM

## 2018-08-14 NOTE — Telephone Encounter (Signed)
-----   Message from Lendon Collar, RT sent at 08/06/2018 11:09 AM EDT ----- Regarding: Lab orders for Friday 08/16/18 Please enter CPE lab orders for 08/16/18. Thanks-Lauren

## 2018-08-16 ENCOUNTER — Other Ambulatory Visit (INDEPENDENT_AMBULATORY_CARE_PROVIDER_SITE_OTHER): Payer: 59

## 2018-08-16 DIAGNOSIS — E119 Type 2 diabetes mellitus without complications: Secondary | ICD-10-CM

## 2018-08-16 LAB — COMPREHENSIVE METABOLIC PANEL
ALT: 44 U/L (ref 0–53)
AST: 23 U/L (ref 0–37)
Albumin: 4.4 g/dL (ref 3.5–5.2)
Alkaline Phosphatase: 74 U/L (ref 39–117)
BILIRUBIN TOTAL: 1.3 mg/dL — AB (ref 0.2–1.2)
BUN: 12 mg/dL (ref 6–23)
CO2: 29 meq/L (ref 19–32)
Calcium: 9.2 mg/dL (ref 8.4–10.5)
Chloride: 101 mEq/L (ref 96–112)
Creatinine, Ser: 1.01 mg/dL (ref 0.40–1.50)
GFR: 83.89 mL/min (ref >60.00)
GLUCOSE: 215 mg/dL — AB (ref 70–99)
Potassium: 3.6 mEq/L (ref 3.5–5.1)
Sodium: 137 mEq/L (ref 135–145)
Total Protein: 6.9 g/dL (ref 6.0–8.3)

## 2018-08-16 LAB — LIPID PANEL
CHOL/HDL RATIO: 6
Cholesterol: 150 mg/dL (ref 0–200)
HDL: 24 mg/dL — ABNORMAL LOW (ref >39.00)
NONHDL: 125.96
TRIGLYCERIDES: 243 mg/dL — AB (ref 0.0–149.0)
VLDL: 48.6 mg/dL — AB (ref 0.0–40.0)

## 2018-08-16 LAB — HEMOGLOBIN A1C: Hgb A1c MFr Bld: 7.6 % — ABNORMAL HIGH (ref 4.6–6.5)

## 2018-08-16 LAB — LDL CHOLESTEROL, DIRECT: LDL DIRECT: 94 mg/dL

## 2018-08-22 ENCOUNTER — Ambulatory Visit (INDEPENDENT_AMBULATORY_CARE_PROVIDER_SITE_OTHER): Payer: 59 | Admitting: Family Medicine

## 2018-08-22 ENCOUNTER — Encounter: Payer: Self-pay | Admitting: Family Medicine

## 2018-08-22 VITALS — BP 118/80 | HR 75 | Temp 98.4°F | Ht 70.5 in | Wt 264.0 lb

## 2018-08-22 DIAGNOSIS — Z0001 Encounter for general adult medical examination with abnormal findings: Secondary | ICD-10-CM | POA: Diagnosis not present

## 2018-08-22 DIAGNOSIS — E119 Type 2 diabetes mellitus without complications: Secondary | ICD-10-CM

## 2018-08-22 DIAGNOSIS — E7800 Pure hypercholesterolemia, unspecified: Secondary | ICD-10-CM

## 2018-08-22 DIAGNOSIS — I10 Essential (primary) hypertension: Secondary | ICD-10-CM

## 2018-08-22 DIAGNOSIS — Z0000 Encounter for general adult medical examination without abnormal findings: Secondary | ICD-10-CM

## 2018-08-22 DIAGNOSIS — L918 Other hypertrophic disorders of the skin: Secondary | ICD-10-CM | POA: Insufficient documentation

## 2018-08-22 LAB — HM DIABETES FOOT EXAM

## 2018-08-22 MED ORDER — METFORMIN HCL ER 500 MG PO TB24: 500.0000 mg | ORAL_TABLET | Freq: Every day | ORAL | 11 refills | Status: DC

## 2018-08-22 MED FILL — METFORMIN HCL ER 500 MG TAB: 500 | 30 days supply | Qty: 30 | Fill #0

## 2018-08-22 NOTE — Assessment & Plan Note (Signed)
Not at goal, but better than last year

## 2018-08-22 NOTE — Assessment & Plan Note (Signed)
Encouraged exercise, weight loss, healthy eating habits. ? ?

## 2018-08-22 NOTE — Assessment & Plan Note (Signed)
Well controlled. Continue current medication.  

## 2018-08-22 NOTE — Assessment & Plan Note (Signed)
LDL at goal <100.

## 2018-08-22 NOTE — Assessment & Plan Note (Signed)
Procedure note.. 5 treated with liquid nitrogen 3 free thaw cycles, no complications.

## 2018-08-22 NOTE — Patient Instructions (Addendum)
Start metformin  Xl 500 mg daily.  Keep working on weight loss, increase exercise and  Decrease sugar and carbs in diet.

## 2018-08-22 NOTE — Progress Notes (Signed)
Subjective:    Patient ID: Philip Shaffer, male    DOB: [birth year], 47 y.o.   MRN: [id number]  HPI The patient is here for annual wellness exam and preventative care.    Hypertension:   Good control on losartan HCTZ  BP Readings from Last 3 Encounters:  08/22/18 118/80  03/06/17 (!) 130/92  04/18/16 140/90  Using medication without problems or lightheadedness: none Chest pain with exertion:none Edema:none Short of breath:none Average home BPs: Other issues:  Diabetes:   Lab Results  Component Value Date   HGBA1C 7.6 (H) 08/16/2018  Using medications without difficulties: Hypoglycemic episodes: Hyperglycemic episodes: Feet problems: no ulcer Blood Sugars averaging:not checking. eye exam within last year: yes  Elevated Cholesterol:  Lab Results  Component Value Date   CHOL 150 08/16/2018   HDL 24.00 (L) 08/16/2018   LDLCALC 73 04/20/2015   LDLDIRECT 94.0 08/16/2018   TRIG 243.0 (H) 08/16/2018   CHOLHDL 6 08/16/2018  Using medications without problems: Muscle aches:  Diet compliance: good  Exercise: ball 2 times a week Other complaints:  Obesity Body mass index is 37.34 kg/m. Wt Readings from Last 3 Encounters:  08/22/18 264 lb (119.7 kg)  03/06/17 275 lb 8 oz (125 kg)  04/18/16 276 lb 12 oz (125.5 kg)     Social History /Family History/Past Medical History reviewed in detail and updated in EMR if needed. Blood pressure 118/80, pulse 75, temperature 98.4 F (36.9 C), temperature source Oral, height 5' 10.5" (1.791 m), weight 264 lb (119.7 kg).  Review of Systems  Constitutional: Negative for fatigue and fever.  HENT: Negative for ear pain.   Eyes: Negative for pain.  Respiratory: Negative for cough and shortness of breath.   Cardiovascular: Negative for chest pain, palpitations and leg swelling.  Gastrointestinal: Negative for abdominal pain.  Genitourinary: Negative for dysuria.  Musculoskeletal: Negative for arthralgias.  Neurological: Negative  for syncope, light-headedness and headaches.  Psychiatric/Behavioral: Negative for dysphoric mood.       Objective:   Physical Exam  Constitutional: He appears well-developed and well-nourished.  Non-toxic appearance. He does not appear ill. No distress.  HENT:  Head: Normocephalic and atraumatic.  Right Ear: Hearing, tympanic membrane, external ear and ear canal normal.  Left Ear: Hearing, tympanic membrane, external ear and ear canal normal.  Nose: Nose normal.  Mouth/Throat: Uvula is midline, oropharynx is clear and moist and mucous membranes are normal.  Eyes: Pupils are equal, round, and reactive to light. Conjunctivae, EOM and lids are normal. Lids are everted and swept, no foreign bodies found.  Neck: Trachea normal, normal range of motion and phonation normal. Neck supple. Carotid bruit is not present. No thyroid mass and no thyromegaly present.  Cardiovascular: Normal rate, regular rhythm, S1 normal, S2 normal, intact distal pulses and normal pulses. Exam reveals no gallop.  No murmur heard. Pulmonary/Chest: Breath sounds normal. He has no wheezes. He has no rhonchi. He has no rales.  Abdominal: Soft. Normal appearance and bowel sounds are normal. There is no hepatosplenomegaly. There is no tenderness. There is no rebound, no guarding and no CVA tenderness. No hernia.  Lymphadenopathy:    He has no cervical adenopathy.  Neurological: He is alert. He has normal strength and normal reflexes. No cranial nerve deficit or sensory deficit. Gait normal.  Skin: Skin is warm, dry and intact. No rash noted.  Psychiatric: He has a normal mood and affect. His speech is normal and behavior is normal. Judgment normal.   5 irritated  skin tags.. 2 in groin , 2 in left armpit and left neck crease... All in ares that rub an get irritated.  Diabetic foot exam: Normal inspection  Small area of irritation where nail cut to short.. No sig of infeciton. No calluses  Normal DP pulses Normal  sensation to light touch and monofilament Nails normal      Assessment & Plan:  The patient's preventative maintenance and recommended screening tests for an annual wellness exam were reviewed in full today. Brought up to date unless services declined.  Counselled on the importance of diet, exercise, and its role in overall health and mortality. The patient's FH and SH was reviewed, including their home life, tobacco status, and drug and alcohol status.   ETOH minimal  nonsmoker.  no early colon cancer or prostate cancer  refused HIV. Refused vaccines

## 2018-08-23 MED FILL — OMEGA-3 ETHYL ESTERS 1 GM C: 1 | 30 days supply | Qty: 120 | Fill #0

## 2018-08-23 MED FILL — LOSARTAN-HCTZ 50-12.5 MG TA: 50-12.5 | 30 days supply | Qty: 30 | Fill #1

## 2018-08-27 NOTE — Progress Notes (Signed)
Sorry.. Exam is correct.. they were irritated skin tags not Seb keratosis, also not premalignant

## 2018-09-20 ENCOUNTER — Other Ambulatory Visit: Payer: Self-pay | Admitting: Family Medicine

## 2018-09-20 MED FILL — LOSARTAN-HCTZ 50-12.5 MG TA: 50-12.5 | 90 days supply | Qty: 90 | Fill #0

## 2018-09-20 MED FILL — OMEGA-3 ETHYL ESTERS 1 GM C: 1 | 30 days supply | Qty: 120 | Fill #1

## 2018-09-20 MED FILL — METFORMIN HCL ER 500 MG TAB: 500 | 90 days supply | Qty: 90 | Fill #1

## 2018-10-24 DIAGNOSIS — M9903 Segmental and somatic dysfunction of lumbar region: Secondary | ICD-10-CM | POA: Diagnosis not present

## 2018-10-24 DIAGNOSIS — G4489 Other headache syndrome: Secondary | ICD-10-CM | POA: Diagnosis not present

## 2018-10-24 DIAGNOSIS — M9905 Segmental and somatic dysfunction of pelvic region: Secondary | ICD-10-CM | POA: Diagnosis not present

## 2018-10-24 DIAGNOSIS — M5408 Panniculitis affecting regions of neck and back, sacral and sacrococcygeal region: Secondary | ICD-10-CM | POA: Diagnosis not present

## 2018-11-07 DIAGNOSIS — M9903 Segmental and somatic dysfunction of lumbar region: Secondary | ICD-10-CM | POA: Diagnosis not present

## 2018-11-07 DIAGNOSIS — G4489 Other headache syndrome: Secondary | ICD-10-CM | POA: Diagnosis not present

## 2018-11-07 DIAGNOSIS — M9905 Segmental and somatic dysfunction of pelvic region: Secondary | ICD-10-CM | POA: Diagnosis not present

## 2018-11-07 DIAGNOSIS — M5408 Panniculitis affecting regions of neck and back, sacral and sacrococcygeal region: Secondary | ICD-10-CM | POA: Diagnosis not present

## 2018-11-08 ENCOUNTER — Other Ambulatory Visit (INDEPENDENT_AMBULATORY_CARE_PROVIDER_SITE_OTHER): Payer: 59

## 2018-11-08 ENCOUNTER — Telehealth: Payer: Self-pay | Admitting: *Deleted

## 2018-11-08 ENCOUNTER — Telehealth: Payer: Self-pay | Admitting: Family Medicine

## 2018-11-08 DIAGNOSIS — E119 Type 2 diabetes mellitus without complications: Secondary | ICD-10-CM

## 2018-11-08 DIAGNOSIS — E7800 Pure hypercholesterolemia, unspecified: Secondary | ICD-10-CM | POA: Diagnosis not present

## 2018-11-08 LAB — COMPREHENSIVE METABOLIC PANEL
ALK PHOS: 65 U/L (ref 39–117)
ALT: 56 U/L — ABNORMAL HIGH (ref 0–53)
AST: 29 U/L (ref 0–37)
Albumin: 4.4 g/dL (ref 3.5–5.2)
BUN: 19 mg/dL (ref 6–23)
CALCIUM: 9.4 mg/dL (ref 8.4–10.5)
CO2: 31 meq/L (ref 19–32)
Chloride: 100 mEq/L (ref 96–112)
Creatinine, Ser: 1.09 mg/dL (ref 0.40–1.50)
GFR: 76.75 mL/min (ref >60.00)
GLUCOSE: 207 mg/dL — AB (ref 70–99)
POTASSIUM: 3.8 meq/L (ref 3.5–5.1)
Sodium: 137 mEq/L (ref 135–145)
Total Bilirubin: 0.9 mg/dL (ref 0.2–1.2)
Total Protein: 6.7 g/dL (ref 6.0–8.3)

## 2018-11-08 LAB — LIPID PANEL
CHOLESTEROL: 171 mg/dL (ref 0–200)
HDL: 20.7 mg/dL — ABNORMAL LOW (ref >39.00)
Total CHOL/HDL Ratio: 8

## 2018-11-08 LAB — LDL CHOLESTEROL, DIRECT: LDL DIRECT: 87 mg/dL

## 2018-11-08 LAB — HEMOGLOBIN A1C: Hgb A1c MFr Bld: 7.9 % — ABNORMAL HIGH (ref 4.6–6.5)

## 2018-11-08 MED ORDER — FENOFIBRATE 160 MG PO TABS: 160.0000 mg | ORAL_TABLET | Freq: Every day | ORAL | 3 refills | Status: DC

## 2018-11-08 NOTE — Telephone Encounter (Signed)
Mr. Hellstrom notified as instructed by telephone.  Fenofibrate prescriptions sent to [facility] as instructed by Dr. Diona Browner.

## 2018-11-08 NOTE — Telephone Encounter (Signed)
-----   Message from Lendon Collar, RT sent at 10/29/2018 10:30 AM EST ----- Regarding: Lab orders for Friday 11/08/18 Please enter 46month DM follow up lab orders for 11/08/18. Thanks!

## 2018-11-08 NOTE — Telephone Encounter (Signed)
-----   Message from Lendon Collar, RT sent at 11/08/2018  8:22 AM EST ----- Regarding: Lab orders for today 11/08/18 Please enter lab orders. Patient came in this morning for a blood draw. Thanks!

## 2018-11-08 NOTE — Telephone Encounter (Signed)
-----   Message from [name], MD sent at 11/08/2018  2:07 PM EST ----- Call  Let pt know triglycerides are very high.. Above 500 and he is at high risk for pancreatitis at this level.  He needs to dramatically decrease fat and sugar in his diet. Call in rx fro fenofibrate 160 mg daily to reduce trigs.

## 2018-11-12 ENCOUNTER — Ambulatory Visit (INDEPENDENT_AMBULATORY_CARE_PROVIDER_SITE_OTHER): Payer: 59 | Admitting: Family Medicine

## 2018-11-12 ENCOUNTER — Encounter: Payer: Self-pay | Admitting: Family Medicine

## 2018-11-12 VITALS — BP 120/80 | HR 85 | Temp 97.8°F | Ht 70.5 in | Wt 267.0 lb

## 2018-11-12 DIAGNOSIS — I10 Essential (primary) hypertension: Secondary | ICD-10-CM | POA: Diagnosis not present

## 2018-11-12 DIAGNOSIS — E7800 Pure hypercholesterolemia, unspecified: Secondary | ICD-10-CM | POA: Diagnosis not present

## 2018-11-12 DIAGNOSIS — E119 Type 2 diabetes mellitus without complications: Secondary | ICD-10-CM

## 2018-11-12 LAB — HM DIABETES FOOT EXAM

## 2018-11-12 NOTE — Assessment & Plan Note (Signed)
Encouraged exercise, weight loss, healthy eating habits. Well controlled. Continue current medication.  

## 2018-11-12 NOTE — Progress Notes (Signed)
   Subjective:    Patient ID: Philip Shaffer, male    DOB: 1971/04/01, 47 y.o.   MRN: [id number]  HPI  47 year old male pt presents for follow up.  Diabetes:  Started on metformin  In 08/2018... No improvement in A1C.  Lab Results  Component Value Date   HGBA1C 7.9 (H) 11/08/2018  Using medications without difficulties: Hypoglycemic episodes: Hyperglycemic episodes: Feet problems: no ulcers Blood Sugars averaging:not checking. eye exam within last year: 07/2018 Body mass index is 37.77 kg/m. Wt Readings from Last 3 Encounters:  11/12/18 267 lb (121.1 kg)  08/22/18 264 lb (119.7 kg)  03/06/17 275 lb 8 oz (125 kg)     Hypertension:   good control on losartan HCTZ. BP Readings from Last 3 Encounters:  11/12/18 120/80  08/22/18 118/80  03/06/17 (!) 130/92   Using medication without problems or lightheadedness: none Chest pain with exertion:none Edema:none Short of breath:none Average home BPs: Other issues:  Elevated Cholesterol:  Called in fenofibrate at time of labs given elevated trig... Pt refused to start any med for this. Lab Results  Component Value Date   CHOL 171 11/08/2018   HDL 20.70 (L) 11/08/2018   LDLCALC 73 04/20/2015   LDLDIRECT 87.0 11/08/2018   TRIG (H) 11/08/2018    536.0 Triglyceride is over 400; calculations on Lipids are invalid.   CHOLHDL 8 11/08/2018  Using medications without problems: Muscle aches:  Diet compliance: poor Exercise: none. Other complaints:  Social History /Family History/Past Medical History reviewed in detail and updated in EMR if needed. Blood pressure 120/80, pulse 85, temperature 97.8 F (36.6 C), temperature source Oral, height 5' 10.5" (1.791 m), weight 267 lb (121.1 kg).   Review of Systems  Constitutional: Negative for fatigue and fever.  HENT: Negative for ear pain.   Eyes: Negative for pain.  Respiratory: Negative for cough and shortness of breath.   Cardiovascular: Negative for chest pain,  palpitations and leg swelling.  Gastrointestinal: Negative for abdominal pain.  Genitourinary: Negative for dysuria.  Musculoskeletal: Positive for back pain. Negative for arthralgias.       Left sciatica.. On voltaren and muscle relaxant , home PT.. improving  Neurological: Negative for syncope, light-headedness and headaches.  Psychiatric/Behavioral: Negative for dysphoric mood.       Objective:   Physical Exam  Constitutional: Vital signs are normal. He appears well-developed and well-nourished.  obese  HENT:  Head: Normocephalic.  Right Ear: Hearing normal.  Left Ear: Hearing normal.  Nose: Nose normal.  Mouth/Throat: Oropharynx is clear and moist and mucous membranes are normal.  Neck: Trachea normal. Carotid bruit is not present. No thyroid mass and no thyromegaly present.  Cardiovascular: Normal rate, regular rhythm and normal pulses. Exam reveals no gallop, no distant heart sounds and no friction rub.  No murmur heard. No peripheral edema  Pulmonary/Chest: Effort normal and breath sounds normal. No respiratory distress.  Skin: Skin is warm, dry and intact. No rash noted.  Psychiatric: He has a normal mood and affect. His speech is normal and behavior is normal. Thought content normal.    Diabetic foot exam: Normal inspection No skin breakdown No calluses  Normal DP pulses Normal sensation to light touch and monofilament Nails normal        Assessment & Plan:

## 2018-11-12 NOTE — Assessment & Plan Note (Signed)
Pt refuses med for elevated trig.Marland Kitchen LDL at goal otherwise.  Will work on low fat low carb diet.  Pt voiced understanding of risk of pancreatitis with elevated trigs.

## 2018-11-12 NOTE — Patient Instructions (Addendum)
Work aggressively on low fat and sugar diet.  Go to ER if signs and symptoms of  Pancreatitis as dicussed.  return for recheck trigs in 1 month.. Lab only appt.  Continue metformin at current dose.. We will increase metofomrin at next OV if A1C not at goal < 7.  As back feeling better.Marland Kitchen Restart exercise.

## 2018-11-12 NOTE — Assessment & Plan Note (Signed)
Inadequate control despite low dose metformin. Pt refuses increase in med.. Wishes to work on lifestyle change. If not at goal at next OV in 3 months.. Will plan increase in dose.

## 2018-11-25 ENCOUNTER — Other Ambulatory Visit: Payer: 59

## 2018-11-29 ENCOUNTER — Ambulatory Visit: Payer: 59 | Admitting: Family Medicine

## 2018-12-03 MED FILL — OMEGA-3 ETHYL ESTERS 1 GM C: 1 | 90 days supply | Qty: 360 | Fill #0

## 2018-12-10 ENCOUNTER — Telehealth: Payer: Self-pay | Admitting: Family Medicine

## 2018-12-10 DIAGNOSIS — E781 Pure hyperglyceridemia: Secondary | ICD-10-CM

## 2018-12-10 NOTE — Telephone Encounter (Signed)
-----   Message from Ellamae Sia sent at 12/02/2018  3:50 PM EST ----- Regarding: Lab orders for Friday, 12.27.19 Lab orders for a 1 month f/u

## 2018-12-13 ENCOUNTER — Other Ambulatory Visit (INDEPENDENT_AMBULATORY_CARE_PROVIDER_SITE_OTHER): Payer: 59

## 2018-12-13 DIAGNOSIS — E781 Pure hyperglyceridemia: Secondary | ICD-10-CM | POA: Diagnosis not present

## 2018-12-13 LAB — LIPID PANEL
Cholesterol: 161 mg/dL (ref 0–200)
HDL: 22.2 mg/dL — ABNORMAL LOW (ref >39.00)
Total CHOL/HDL Ratio: 7
Triglycerides: 480 mg/dL — ABNORMAL HIGH (ref 0.0–149.0)

## 2018-12-13 LAB — LDL CHOLESTEROL, DIRECT: Direct LDL: 75 mg/dL

## 2018-12-25 MED FILL — HYDROCHLOROTHIAZIDE 12.5 MG: 12.5 | 90 days supply | Qty: 90 | Fill #0

## 2018-12-25 MED FILL — metFORMIN HCL ER 500 MG TB2: 500 | 90 days supply | Qty: 90 | Fill #2

## 2018-12-25 MED FILL — LOSARTAN POTASSIUM 50 MG TA: 50 | 30 days supply | Qty: 30 | Fill #0

## 2019-01-31 MED FILL — LOSARTAN POTASSIUM 50 MG TA: 50 | 60 days supply | Qty: 60 | Fill #1

## 2019-03-29 ENCOUNTER — Other Ambulatory Visit: Payer: Self-pay | Admitting: Family Medicine

## 2019-03-29 MED FILL — metFORMIN HCL ER 500 MG TB2: 500 | 90 days supply | Qty: 90 | Fill #3

## 2019-03-31 MED FILL — LOSARTAN POTASSIUM 50 MG TA: 50 | 60 days supply | Qty: 90 | Fill #0

## 2019-03-31 MED FILL — HYDROCHLOROTHIAZIDE 12.5 MG: 12.5 | 90 days supply | Qty: 90 | Fill #0

## 2019-05-24 ENCOUNTER — Telehealth: Payer: Self-pay | Admitting: Family Medicine

## 2019-05-26 DIAGNOSIS — M9905 Segmental and somatic dysfunction of pelvic region: Secondary | ICD-10-CM | POA: Diagnosis not present

## 2019-05-26 DIAGNOSIS — M9903 Segmental and somatic dysfunction of lumbar region: Secondary | ICD-10-CM | POA: Diagnosis not present

## 2019-05-26 DIAGNOSIS — G4489 Other headache syndrome: Secondary | ICD-10-CM | POA: Diagnosis not present

## 2019-05-26 DIAGNOSIS — M2560 Stiffness of unspecified joint, not elsewhere classified: Secondary | ICD-10-CM | POA: Diagnosis not present

## 2019-05-26 MED FILL — LOSARTAN POTASSIUM 50 MG TA: 50 | 30 days supply | Qty: 30 | Fill #0

## 2019-05-26 NOTE — Telephone Encounter (Signed)
Please schedulefollow up DM with labs prior with Dr. Diona Browner.

## 2019-06-10 NOTE — Telephone Encounter (Signed)
Left message asking pt to call office  °

## 2019-06-18 NOTE — Telephone Encounter (Signed)
Lab 7/7  Follow 7/9 Pt aware

## 2019-06-20 MED FILL — LOSARTAN POTASSIUM 50 MG TA: 50 | 30 days supply | Qty: 30 | Fill #1

## 2019-06-23 ENCOUNTER — Telehealth: Payer: Self-pay | Admitting: Family Medicine

## 2019-06-23 ENCOUNTER — Other Ambulatory Visit: Payer: Self-pay | Admitting: Family Medicine

## 2019-06-23 DIAGNOSIS — E119 Type 2 diabetes mellitus without complications: Secondary | ICD-10-CM

## 2019-06-23 MED FILL — HYDROCHLOROTHIAZIDE 12.5 MG: 12.5 | 90 days supply | Qty: 90 | Fill #0

## 2019-06-23 NOTE — Telephone Encounter (Signed)
-----   Message from Ellamae Sia sent at 06/23/2019 10:08 AM EDT ----- Regarding: Lab orders for Tuesday, 7.7.20 Dm labs

## 2019-06-24 ENCOUNTER — Other Ambulatory Visit: Payer: Self-pay

## 2019-06-24 ENCOUNTER — Other Ambulatory Visit (INDEPENDENT_AMBULATORY_CARE_PROVIDER_SITE_OTHER): Payer: 59

## 2019-06-24 DIAGNOSIS — E119 Type 2 diabetes mellitus without complications: Secondary | ICD-10-CM | POA: Diagnosis not present

## 2019-06-24 LAB — COMPREHENSIVE METABOLIC PANEL
ALT: 38 U/L (ref 0–53)
AST: 21 U/L (ref 0–37)
Albumin: 4.2 g/dL (ref 3.5–5.2)
Alkaline Phosphatase: 68 U/L (ref 39–117)
BUN: 11 mg/dL (ref 6–23)
CO2: 28 mEq/L (ref 19–32)
Calcium: 8.6 mg/dL (ref 8.4–10.5)
Chloride: 106 mEq/L (ref 96–112)
Creatinine, Ser: 0.98 mg/dL (ref 0.40–1.50)
GFR: 81.43 mL/min (ref >60.00)
Glucose, Bld: 175 mg/dL — ABNORMAL HIGH (ref 70–99)
Potassium: 4 mEq/L (ref 3.5–5.1)
Sodium: 139 mEq/L (ref 135–145)
Total Bilirubin: 0.8 mg/dL (ref 0.2–1.2)
Total Protein: 6.3 g/dL (ref 6.0–8.3)

## 2019-06-24 LAB — LIPID PANEL
Cholesterol: 136 mg/dL (ref 0–200)
HDL: 24.2 mg/dL — ABNORMAL LOW (ref >39.00)
NonHDL: 111.33
Total CHOL/HDL Ratio: 6
Triglycerides: 208 mg/dL — ABNORMAL HIGH (ref 0.0–149.0)
VLDL: 41.6 mg/dL — ABNORMAL HIGH (ref 0.0–40.0)

## 2019-06-24 LAB — HEMOGLOBIN A1C: Hgb A1c MFr Bld: 7.5 % — ABNORMAL HIGH (ref 4.6–6.5)

## 2019-06-24 LAB — LDL CHOLESTEROL, DIRECT: Direct LDL: 85 mg/dL

## 2019-06-26 ENCOUNTER — Ambulatory Visit (INDEPENDENT_AMBULATORY_CARE_PROVIDER_SITE_OTHER): Payer: 59 | Admitting: Family Medicine

## 2019-06-26 ENCOUNTER — Encounter: Payer: Self-pay | Admitting: Family Medicine

## 2019-06-26 ENCOUNTER — Other Ambulatory Visit: Payer: Self-pay

## 2019-06-26 VITALS — Ht 70.5 in | Wt 256.0 lb

## 2019-06-26 DIAGNOSIS — E7800 Pure hypercholesterolemia, unspecified: Secondary | ICD-10-CM

## 2019-06-26 DIAGNOSIS — E781 Pure hyperglyceridemia: Secondary | ICD-10-CM | POA: Diagnosis not present

## 2019-06-26 DIAGNOSIS — E119 Type 2 diabetes mellitus without complications: Secondary | ICD-10-CM | POA: Diagnosis not present

## 2019-06-26 DIAGNOSIS — I10 Essential (primary) hypertension: Secondary | ICD-10-CM

## 2019-06-26 MED ORDER — METFORMIN HCL ER 500 MG PO TB24: 1000.0000 mg | ORAL_TABLET | Freq: Every day | ORAL | 3 refills | Status: DC

## 2019-06-26 MED FILL — METFORMIN HCL ER 500 MG TB2: 500 | 30 days supply | Qty: 60 | Fill #0

## 2019-06-26 NOTE — Progress Notes (Signed)
VIRTUAL VISIT Due to national recommendations of social distancing due to [name] 19, a virtual visit is felt to be most appropriate for this patient at this time.   I connected with the patient on 06/26/19 at  8:20 AM EDT by virtual telehealth platform and verified that I am speaking with the correct person using two identifiers.   I discussed the limitations, risks, security and privacy concerns of performing an evaluation and management service by  virtual telehealth platform and the availability of in person appointments. I also discussed with the patient that there may be a patient responsible charge related to this service. The patient expressed understanding and agreed to proceed.  Patient location: Home Provider Location: [facility] Participants: [name] and Kathrin Penner   Chief Complaint  Patient presents with  . Diabetes    History of Present Illness: 48 year old male presents for follow up DM.  Diabetes:  Some improvement on metformin  XL 500 mg daily. Lab Results  Component Value Date   HGBA1C 7.5 (H) 06/24/2019  Using medications without difficulties: None Hypoglycemic episodes: Hyperglycemic episodes: Feet problems: no ulcers, wart present no sign of infection Blood Sugars averaging: not checking eye exam within last year: update  Elevated Cholesterol: Trigs high but LDL at goal < 100 Lab Results  Component Value Date   CHOL 136 06/24/2019   HDL 24.20 (L) 06/24/2019   LDLCALC 73 04/20/2015   LDLDIRECT 85.0 06/24/2019   TRIG 208.0 (H) 06/24/2019   CHOLHDL 6 06/24/2019  Using medications without problems: Muscle aches:  Diet compliance: moderate Exercise:none Other complaints: Wt Readings from Last 3 Encounters:  06/26/19 256 lb (116.1 kg)  11/12/18 267 lb (121.1 kg)  08/22/18 264 lb (119.7 kg)     Hypertension:  No BP measurement for today but previously well controlled on  HCTZ and losartan. Good control at chiropractor  recently. BP Readings from Last 3 Encounters:  11/12/18 120/80  08/22/18 118/80  03/06/17 (!) 130/92  Using medication without problems or lightheadedness: none Chest pain with exertion:none Edema:none Short of breath:none Average home BPs: Other issues:  COVID 19 screen No recent travel or known exposure to Eugenio Saenz The patient denies respiratory symptoms of COVID 19 at this time.  The importance of social distancing was discussed today.   Review of Systems  Constitutional: Negative for chills and fever.  HENT: Negative for congestion and ear pain.   Eyes: Negative for pain and redness.  Respiratory: Negative for cough and shortness of breath.   Cardiovascular: Negative for chest pain, palpitations and leg swelling.  Gastrointestinal: Negative for abdominal pain, blood in stool, constipation, diarrhea, nausea and vomiting.  Genitourinary: Negative for dysuria.  Musculoskeletal: Negative for falls and myalgias.  Skin: Negative for rash.  Neurological: Negative for dizziness.  Psychiatric/Behavioral: Negative for depression. The patient is not nervous/anxious.       Past Medical History:  Diagnosis Date  . Nonspecific elevation of levels of transaminase or lactic acid dehydrogenase (LDH)     reports that he has never smoked. He has never used smokeless tobacco. He reports that he does not drink alcohol or use drugs.   Current Outpatient Medications:  .  hydrochlorothiazide (MICROZIDE) 12.5 MG capsule, TAKE 1 CAPSULE BY MOUTH ONCE DAILY, Disp: 90 capsule, Rfl: 0 .  losartan (COZAAR) 50 MG tablet, TAKE 1 TABLET BY MOUTH DAILY., Disp: 90 tablet, Rfl: 0 .  metFORMIN (GLUCOPHAGE-XR) 500 MG 24 hr tablet, Take 1 tablet (500 mg total) by  mouth daily with breakfast., Disp: 30 tablet, Rfl: 11 .  omega-3 acid ethyl esters (LOVAZA) 1 g capsule, TAKE 2 CAPSULES BY MOUTH 2 TIMES DAILY., Disp: 360 capsule, Rfl: 3   Observations/Objective: Height 5' 10.5" (1.791 m), weight 256 lb (116.1  kg).  Physical Exam  Physical Exam Constitutional:  obese    General: The patient is not in acute distress. Pulmonary:     Effort: Pulmonary effort is normal. No respiratory distress.  Neurological:     Mental Status: The patient is alert and oriented to person, place, and time.  Psychiatric:        Mood and Affect: Mood normal.        Behavior: Behavior normal.   Has wart on foot.. hard to see on screen   Assessment and Plan  ESSENTIAL HYPERTENSION, BENIGN Well controlled. Continue current medication. Encouraged exercise, weight loss, healthy eating habits.   High cholesterol LDL at goal.. given DM statin recommended but will hold on start until DM improved control.  Diabetes mellitus with no complication Improving control with metformin... increase further to get A1C to goal.. 2 tabs XL daily. Reviewed lifestyle changes.     I discussed the assessment and treatment plan with the patient. The patient was provided an opportunity to ask questions and all were answered. The patient agreed with the plan and demonstrated an understanding of the instructions.   The patient was advised to call back or seek an in-person evaluation if the symptoms worsen or if the condition fails to improve as anticipated.     [name], MD

## 2019-06-26 NOTE — Assessment & Plan Note (Signed)
Improving control with metformin... increase further to get A1C to goal.. 2 tabs XL daily. Reviewed lifestyle changes.

## 2019-06-26 NOTE — Assessment & Plan Note (Signed)
LDL at goal.. given DM statin recommended but will hold on start until DM improved control.

## 2019-06-26 NOTE — Assessment & Plan Note (Signed)
Well controlled. Continue current medication. Encouraged exercise, weight loss, healthy eating habits.  

## 2019-07-19 MED FILL — LOSARTAN POTASSIUM 50 MG TA: 50 | 30 days supply | Qty: 30 | Fill #2

## 2019-07-21 MED FILL — METFORMIN HCL ER 500 MG TB2: 500 | 30 days supply | Qty: 60 | Fill #1

## 2019-08-18 ENCOUNTER — Other Ambulatory Visit: Payer: Self-pay | Admitting: Family Medicine

## 2019-08-18 MED FILL — LOSARTAN POTASSIUM 50 MG TA: 50 | 30 days supply | Qty: 30 | Fill #0

## 2019-08-19 MED FILL — metFORMIN HCL ER 500 MG TB2: 500 | 30 days supply | Qty: 60 | Fill #2

## 2019-09-12 MED FILL — LOSARTAN POTASSIUM 50 MG TA: 50 | 30 days supply | Qty: 30 | Fill #1

## 2019-09-17 ENCOUNTER — Other Ambulatory Visit: Payer: Self-pay | Admitting: Family Medicine

## 2019-09-17 ENCOUNTER — Other Ambulatory Visit: Payer: Self-pay | Admitting: *Deleted

## 2019-09-17 MED ORDER — HYDROCHLOROTHIAZIDE 12.5 MG PO CAPS: 12.5000 mg | ORAL_CAPSULE | Freq: Every day | ORAL | 1 refills | Status: DC

## 2019-09-17 MED FILL — HYDROCHLOROTHIAZIDE 12.5 MG: 12.5 | 90 days supply | Qty: 90 | Fill #0

## 2019-09-19 MED FILL — METFORMIN HCL ER 500 MG TB2: 500 | 30 days supply | Qty: 60 | Fill #3

## 2019-10-11 MED FILL — LOSARTAN POTASSIUM 50 MG TA: 50 | 30 days supply | Qty: 30 | Fill #2

## 2019-10-18 MED FILL — metFORMIN HCL ER 500 MG TB2: 500 | 30 days supply | Qty: 60 | Fill #4

## 2019-11-10 MED FILL — LOSARTAN POTASSIUM 50 MG TA: 50 | 30 days supply | Qty: 30 | Fill #3

## 2019-11-18 MED FILL — METFORMIN HCL ER 500 MG TB2: 500 | 30 days supply | Qty: 60 | Fill #5

## 2019-11-21 ENCOUNTER — Other Ambulatory Visit: Payer: Self-pay

## 2019-11-21 DIAGNOSIS — Z20822 Contact with and (suspected) exposure to covid-19: Secondary | ICD-10-CM

## 2019-11-22 LAB — NOVEL CORONAVIRUS, NAA: SARS-CoV-2, NAA: NOT DETECTED

## 2019-12-10 MED FILL — LOSARTAN POTASSIUM 50 MG TA: 50 | 30 days supply | Qty: 30 | Fill #4

## 2019-12-10 MED FILL — HYDROCHLOROTHIAZIDE 12.5 MG: 12.5 | 90 days supply | Qty: 90 | Fill #1

## 2019-12-16 DIAGNOSIS — H524 Presbyopia: Secondary | ICD-10-CM | POA: Diagnosis not present

## 2019-12-16 DIAGNOSIS — H5212 Myopia, left eye: Secondary | ICD-10-CM | POA: Diagnosis not present

## 2019-12-16 DIAGNOSIS — H40032 Anatomical narrow angle, left eye: Secondary | ICD-10-CM | POA: Diagnosis not present

## 2019-12-16 DIAGNOSIS — Q132 Other congenital malformations of iris: Secondary | ICD-10-CM | POA: Diagnosis not present

## 2019-12-16 DIAGNOSIS — H5201 Hypermetropia, right eye: Secondary | ICD-10-CM | POA: Diagnosis not present

## 2019-12-16 DIAGNOSIS — H52223 Regular astigmatism, bilateral: Secondary | ICD-10-CM | POA: Diagnosis not present

## 2019-12-17 MED FILL — metFORMIN HCL ER 500 MG TB2: 500 | 90 days supply | Qty: 180 | Fill #6

## 2020-01-09 MED FILL — LOSARTAN POTASSIUM 50 MG TA: 50 | 30 days supply | Qty: 30 | Fill #5

## 2020-02-02 DIAGNOSIS — M9901 Segmental and somatic dysfunction of cervical region: Secondary | ICD-10-CM | POA: Diagnosis not present

## 2020-02-02 DIAGNOSIS — G4489 Other headache syndrome: Secondary | ICD-10-CM | POA: Diagnosis not present

## 2020-02-02 DIAGNOSIS — M2560 Stiffness of unspecified joint, not elsewhere classified: Secondary | ICD-10-CM | POA: Diagnosis not present

## 2020-02-09 ENCOUNTER — Other Ambulatory Visit: Payer: Self-pay | Admitting: Family Medicine

## 2020-02-09 MED FILL — LOSARTAN POTASSIUM 50 MG TA: 50 | 30 days supply | Qty: 30 | Fill #0

## 2020-02-09 NOTE — Telephone Encounter (Signed)
Please schedule CPE with fasting labs or DM check with fasting labs for Dr. Diona Browner.  He was suppose to follow up in Oct 2020.

## 2020-02-11 NOTE — Telephone Encounter (Signed)
Left message asking pt to call office  °

## 2020-02-16 ENCOUNTER — Encounter: Payer: Self-pay | Admitting: Family Medicine

## 2020-02-16 NOTE — Telephone Encounter (Signed)
Left message asking pt to call office Mailed letter 

## 2020-03-04 ENCOUNTER — Other Ambulatory Visit: Payer: Self-pay | Admitting: Family Medicine

## 2020-03-04 NOTE — Telephone Encounter (Signed)
Last office visit 06/26/2019 for DM.  AVS states to return in 3 months around 09/26/2019.  Shirlean Mylar has called and mailed letter to patient to call and schedule CPE.  No future appointments.  Refill?

## 2020-03-06 MED FILL — LOSARTAN POTASSIUM 50 MG TA: 50 | 30 days supply | Qty: 30 | Fill #0

## 2020-03-09 ENCOUNTER — Other Ambulatory Visit: Payer: Self-pay | Admitting: Family Medicine

## 2020-03-09 MED FILL — HYDROCHLOROTHIAZIDE 12.5 MG: 12.5 | 30 days supply | Qty: 30 | Fill #0

## 2020-03-16 MED FILL — metFORMIN HCL ER 500 MG TB2: 500 | 90 days supply | Qty: 180 | Fill #7

## 2020-03-30 ENCOUNTER — Other Ambulatory Visit: Payer: Self-pay | Admitting: Family Medicine

## 2020-03-30 NOTE — Telephone Encounter (Signed)
Rx was last refilled on 03/04/20 for #30 with 0 refills. Per last refill note, patient needs an office visit for further refills. Patient was last seen on 06/26/19, and has no upcoming appts.  I left a VM for patient to return phone call to schedule office visit.

## 2020-03-31 NOTE — Telephone Encounter (Signed)
Last office visit 06/26/2019 for DM.  AVS states to return in 3 months around 09/26/2019.  Shirlean Mylar has called and mailed letter to patient to call and schedule CPE.  Brooke left message yesterday asking patient to call office to schedule appointment.  No future appointments.  Refill?

## 2020-04-02 ENCOUNTER — Other Ambulatory Visit: Payer: Self-pay | Admitting: Family Medicine

## 2020-04-02 MED FILL — LOSARTAN POTASSIUM 50 MG TA: 50 | 30 days supply | Qty: 30 | Fill #0

## 2020-04-02 MED FILL — HYDROCHLOROTHIAZIDE 12.5 MG: 12.5 | 30 days supply | Qty: 30 | Fill #0

## 2020-04-12 ENCOUNTER — Ambulatory Visit: Payer: 59 | Attending: Internal Medicine

## 2020-04-12 DIAGNOSIS — Z23 Encounter for immunization: Secondary | ICD-10-CM

## 2020-04-12 NOTE — Progress Notes (Signed)
   Covid-19 Vaccination Clinic  Name:  Philip Shaffer    MRN: [id number] DOB: 02-17-1971  04/12/2020  [name] was observed post Covid-19 immunization for 15 minutes without incident. He was provided with Vaccine Information Sheet and instruction to access the V-Safe system.   [name] was instructed to call 911 with any severe reactions post vaccine: Marland Kitchen Difficulty breathing  . Swelling of face and throat  . A fast heartbeat  . A bad rash all over body  . Dizziness and weakness   Immunizations Administered    Name Date Dose VIS Date Route   Pfizer COVID-19 Vaccine 04/12/2020 11:26 AM 0.3 mL 02/11/2019 Intramuscular   Manufacturer: [place]   Lot: [id number]   [place]: [id number]

## 2020-04-26 ENCOUNTER — Other Ambulatory Visit: Payer: Self-pay | Admitting: Family Medicine

## 2020-05-04 ENCOUNTER — Ambulatory Visit: Payer: 59

## 2020-05-06 ENCOUNTER — Ambulatory Visit: Payer: 59 | Attending: Internal Medicine

## 2020-05-06 DIAGNOSIS — Z23 Encounter for immunization: Secondary | ICD-10-CM

## 2020-05-06 NOTE — Progress Notes (Signed)
   Covid-19 Vaccination Clinic  Name:  Philip Shaffer    MRN: [id number] DOB: [birth year]  05/06/2020  Philip Shaffer was observed post Covid-19 immunization for 15 minutes without incident. He was provided with Vaccine Information Sheet and instruction to access the V-Safe system.   Philip Shaffer was instructed to call 911 with any severe reactions post vaccine: Marland Kitchen Difficulty breathing  . Swelling of face and throat  . A fast heartbeat  . A bad rash all over body  . Dizziness and weakness   Immunizations Administered    Name Date Dose VIS Date Route   Pfizer COVID-19 Vaccine 05/06/2020  9:12 AM 0.3 mL 02/11/2019 Intramuscular   Manufacturer: [employer], [employer]   Lot: [id number]   [place]: [id number]

## 2020-05-17 LAB — HM DIABETES FOOT EXAM

## 2020-06-15 ENCOUNTER — Other Ambulatory Visit: Payer: Self-pay | Admitting: Family Medicine

## 2020-06-15 ENCOUNTER — Encounter: Payer: Self-pay | Admitting: Family Medicine

## 2020-06-15 ENCOUNTER — Other Ambulatory Visit: Payer: Self-pay

## 2020-06-15 ENCOUNTER — Ambulatory Visit: Payer: 59 | Admitting: Family Medicine

## 2020-06-15 VITALS — BP 132/84 | HR 77 | Temp 97.9°F | Ht 70.5 in | Wt 262.8 lb

## 2020-06-15 DIAGNOSIS — E118 Type 2 diabetes mellitus with unspecified complications: Secondary | ICD-10-CM

## 2020-06-15 DIAGNOSIS — Z23 Encounter for immunization: Secondary | ICD-10-CM | POA: Diagnosis not present

## 2020-06-15 DIAGNOSIS — Z0000 Encounter for general adult medical examination without abnormal findings: Secondary | ICD-10-CM

## 2020-06-15 DIAGNOSIS — I10 Essential (primary) hypertension: Secondary | ICD-10-CM | POA: Diagnosis not present

## 2020-06-15 DIAGNOSIS — IMO0002 Reserved for concepts with insufficient information to code with codable children: Secondary | ICD-10-CM

## 2020-06-15 DIAGNOSIS — E1169 Type 2 diabetes mellitus with other specified complication: Secondary | ICD-10-CM | POA: Diagnosis not present

## 2020-06-15 DIAGNOSIS — E785 Hyperlipidemia, unspecified: Secondary | ICD-10-CM

## 2020-06-15 DIAGNOSIS — E1159 Type 2 diabetes mellitus with other circulatory complications: Secondary | ICD-10-CM | POA: Diagnosis not present

## 2020-06-15 DIAGNOSIS — E1165 Type 2 diabetes mellitus with hyperglycemia: Secondary | ICD-10-CM

## 2020-06-15 DIAGNOSIS — Z1211 Encounter for screening for malignant neoplasm of colon: Secondary | ICD-10-CM

## 2020-06-15 DIAGNOSIS — Z1159 Encounter for screening for other viral diseases: Secondary | ICD-10-CM | POA: Diagnosis not present

## 2020-06-15 LAB — LIPID PANEL
Cholesterol: 157 mg/dL (ref 0–200)
HDL: 27.8 mg/dL — ABNORMAL LOW (ref >39.00)
NonHDL: 129.17
Total CHOL/HDL Ratio: 6
Triglycerides: 345 mg/dL — ABNORMAL HIGH (ref 0.0–149.0)
VLDL: 69 mg/dL — ABNORMAL HIGH (ref 0.0–40.0)

## 2020-06-15 LAB — COMPREHENSIVE METABOLIC PANEL
ALT: 59 U/L — ABNORMAL HIGH (ref 0–53)
AST: 30 U/L (ref 0–37)
Albumin: 4.5 g/dL (ref 3.5–5.2)
Alkaline Phosphatase: 68 U/L (ref 39–117)
BUN: 15 mg/dL (ref 6–23)
CO2: 30 mEq/L (ref 19–32)
Calcium: 9.5 mg/dL (ref 8.4–10.5)
Chloride: 100 mEq/L (ref 96–112)
Creatinine, Ser: 1.02 mg/dL (ref 0.40–1.50)
GFR: 77.44 mL/min (ref >60.00)
Glucose, Bld: 217 mg/dL — ABNORMAL HIGH (ref 70–99)
Potassium: 4.3 mEq/L (ref 3.5–5.1)
Sodium: 136 mEq/L (ref 135–145)
Total Bilirubin: 0.9 mg/dL (ref 0.2–1.2)
Total Protein: 6.7 g/dL (ref 6.0–8.3)

## 2020-06-15 LAB — LDL CHOLESTEROL, DIRECT: Direct LDL: 81 mg/dL

## 2020-06-15 LAB — HEMOGLOBIN A1C: Hgb A1c MFr Bld: 8.1 % — ABNORMAL HIGH (ref 4.6–6.5)

## 2020-06-15 MED ORDER — METFORMIN HCL ER 500 MG PO TB24: 1000.0000 mg | ORAL_TABLET | Freq: Two times a day (BID) | ORAL | 11 refills | Status: DC

## 2020-06-15 MED ORDER — ATORVASTATIN CALCIUM 10 MG PO TABS
10.0000 mg | ORAL_TABLET | Freq: Every day | ORAL | 11 refills | Status: DC
Start: 2020-06-15 — End: 2020-06-15

## 2020-06-15 MED FILL — ATORVASTATIN CALCIUM 10 MG: 10 | 30 days supply | Qty: 30 | Fill #0

## 2020-06-15 MED FILL — metFORMIN HCL ER 500 MG TB2: 500 | 90 days supply | Qty: 180 | Fill #0

## 2020-06-15 NOTE — Patient Instructions (Addendum)
GI office will call to set up colonoscopy. Plan starting cholesterol medication to lower risk of heart disease in setting of diabetes.  Please stop at the lab to have labs drawn. Start exercise 3-5 days a week.  Work on low carbohydrate low fat diet.

## 2020-06-15 NOTE — Addendum Note (Signed)
Addended by: Carter Kitten on: 06/15/2020 10:11 AM   Modules accepted: Orders

## 2020-06-15 NOTE — Assessment & Plan Note (Signed)
Complication HTN. Due for-re-eval.

## 2020-06-15 NOTE — Assessment & Plan Note (Signed)
Well controlled. Continue current medication. On ARB. 

## 2020-06-15 NOTE — Assessment & Plan Note (Signed)
Due for re-eval. Statin indicated no matter results given DM presence.

## 2020-06-15 NOTE — Progress Notes (Signed)
Chief Complaint  Patient presents with  . Annual Exam    History of Present Illness: HPI The patient is here for annual wellness exam and preventative care.     Rash on left sole of foot x 2 months.. not itchy. Has only tried moisturizer.  Diabetes: Due for re-eval. On metformin. Using medications without difficulties: none Hypoglycemic episodes:none Hyperglycemic episodes:none Feet problems: rash, no ulcers Blood Sugars averaging: not checking. eye exam within last year: yes [facility], records requested.  Elevated Cholesterol: Due for re-eval. On no medication. Statin indicated given DM. Using medications without problems: Muscle aches:  Diet compliance: moderate.. low carb Exercise: minimal Other complaints:  Severe obesity:  Wt Readings from Last 3 Encounters:  06/15/20 262 lb 12 oz (119.2 kg)  06/26/19 256 lb (116.1 kg)  11/12/18 267 lb (121.1 kg)  Body mass index is 37.17 kg/m.\  The 10-year ASCVD risk score Mikey Bussing DC Jr., et al., 2013) is: 9.1%   Values used to calculate the score:     Age: 49 years     Sex: Male     Is Non-Hispanic African American: No     Diabetic: Yes     Tobacco smoker: No     Systolic Blood Pressure: 287 mmHg     Is BP treated: Yes     HDL Cholesterol: 24.2 mg/dL     Total Cholesterol: 136 mg/dL    This visit occurred during the SARS-CoV-2 public health emergency.  Safety protocols were in place, including screening questions prior to the visit, additional usage of staff PPE, and extensive cleaning of exam room while observing appropriate contact time as indicated for disinfecting solutions.   COVID 19 screen:  No recent travel or known exposure to COVID19 The patient denies respiratory symptoms of COVID 19 at this time. The importance of social distancing was discussed today.     ROS    Past Medical History:  Diagnosis Date  . Nonspecific elevation of levels of transaminase or lactic acid dehydrogenase (LDH)     reports  that he has never smoked. He has never used smokeless tobacco. He reports that he does not drink alcohol and does not use drugs.   Current Outpatient Medications:  .  losartan-hydrochlorothiazide (HYZAAR) 50-12.5 MG tablet, Take 1 tablet by mouth daily., Disp: , Rfl:  .  metFORMIN (GLUCOPHAGE-XR) 500 MG 24 hr tablet, Take 2 tablets (1,000 mg total) by mouth daily with breakfast., Disp: 180 tablet, Rfl: 3   Observations/Objective: Blood pressure 132/84, pulse 77, temperature 97.9 F (36.6 C), temperature source Temporal, height 5' 10.5" (1.791 m), weight 262 lb 12 oz (119.2 kg), SpO2 95 %.  Physical Exam Constitutional:      General: He is not in acute distress.    Appearance: Normal appearance. He is well-developed. He is not ill-appearing or toxic-appearing.     Comments: Muscular but central obesity  HENT:     Head: Normocephalic and atraumatic.     Right Ear: Hearing, tympanic membrane, ear canal and external ear normal.     Left Ear: Hearing, tympanic membrane, ear canal and external ear normal.     Nose: Nose normal.     Mouth/Throat:     Pharynx: Uvula midline.  Eyes:     General: Lids are normal. Lids are everted, no foreign bodies appreciated.     Conjunctiva/sclera: Conjunctivae normal.     Pupils: Pupils are equal, round, and reactive to light.  Neck:     Thyroid: No  thyroid mass or thyromegaly.     Vascular: No carotid bruit.     Trachea: Trachea and phonation normal.  Cardiovascular:     Rate and Rhythm: Normal rate and regular rhythm.     Pulses: Normal pulses.     Heart sounds: S1 normal and S2 normal. No murmur heard.  No gallop.   Pulmonary:     Breath sounds: Normal breath sounds. No wheezing, rhonchi or rales.  Abdominal:     General: Bowel sounds are normal.     Palpations: Abdomen is soft.     Tenderness: There is no abdominal tenderness. There is no guarding or rebound.     Hernia: No hernia is present.  Musculoskeletal:     Cervical back: Normal range  of motion and neck supple.  Lymphadenopathy:     Cervical: No cervical adenopathy.  Skin:    General: Skin is warm and dry.     Findings: No rash.  Neurological:     Mental Status: He is alert.     Cranial Nerves: No cranial nerve deficit.     Sensory: No sensory deficit.     Gait: Gait normal.     Deep Tendon Reflexes: Reflexes are normal and symmetric.  Psychiatric:        Speech: Speech normal.        Behavior: Behavior normal.        Judgment: Judgment normal.       Diabetic foot exam: Normal inspection No skin breakdown No calluses  Normal DP pulses Normal sensation to light touch and monofilament Nails normal  Assessment and Plan   The patient's preventative maintenance and recommended screening tests for an annual wellness exam were reviewed in full today. Brought up to date unless services declined.  Counselled on the importance of diet, exercise, and its role in overall health and mortality. The patient's FH and SH was reviewed, including their home life, tobacco status, and drug and alcohol status.      Vaccines: Due for Tdap and pneumovax, S/P COVID9 vaccine Prostate Cancer Screen: Start age 80 Colon Cancer Screen: discussed and ordered colonoscopy      Smoking Status: none ETOH/ drug HW[place]:SHUO/HFGB  Hep C:   Due.  HIV screen:  refused  Diabetes mellitus type 2, uncontrolled, with complications ([place]) Complication HTN. Due for-re-eval.    Hypertension associated with diabetes ([place]) Well controlled. Continue current medication.  On ARB.  Hyperlipidemia associated with type 2 diabetes mellitus ([place]) Due for re-eval. Statin indicated no matter results given DM presence.    [name], MD

## 2020-06-16 LAB — HEPATITIS C ANTIBODY
Hepatitis C Ab: NONREACTIVE
SIGNAL TO CUT-OFF: 0.01 (ref <1.00)

## 2020-06-17 ENCOUNTER — Other Ambulatory Visit: Payer: 59

## 2020-06-24 ENCOUNTER — Encounter: Payer: 59 | Admitting: Family Medicine

## 2020-07-01 ENCOUNTER — Encounter: Payer: Self-pay | Admitting: Internal Medicine

## 2020-07-09 MED FILL — ATORVASTATIN CALCIUM 10 MG: 10 | 90 days supply | Qty: 90 | Fill #1

## 2020-07-29 ENCOUNTER — Other Ambulatory Visit: Payer: Self-pay | Admitting: Family Medicine

## 2020-08-11 ENCOUNTER — Other Ambulatory Visit: Payer: Self-pay | Admitting: Family Medicine

## 2020-08-11 MED FILL — LOSARTAN-HCTZ 50-12.5 MG TA: 50-12.5 | 90 days supply | Qty: 90 | Fill #0

## 2020-08-18 ENCOUNTER — Ambulatory Visit (AMBULATORY_SURGERY_CENTER): Payer: Self-pay | Admitting: *Deleted

## 2020-08-18 ENCOUNTER — Encounter: Payer: Self-pay | Admitting: Internal Medicine

## 2020-08-18 ENCOUNTER — Other Ambulatory Visit: Payer: Self-pay

## 2020-08-18 VITALS — Ht 70.5 in | Wt 259.0 lb

## 2020-08-18 DIAGNOSIS — Z1211 Encounter for screening for malignant neoplasm of colon: Secondary | ICD-10-CM

## 2020-08-18 MED ORDER — NA SULFATE-K SULFATE-MG SULF 17.5-3.13-1.6 GM/177ML PO SOLN: ORAL | 0 refills | Status: DC

## 2020-08-18 MED FILL — SUPREP BOWEL PREP KIT: 17.5-3.13-1 | 1 days supply | Qty: 354 | Fill #0

## 2020-08-18 NOTE — Progress Notes (Signed)
Patient is here in-person for PV. Patient denies any allergies to eggs or soy. Patient denies any problems with anesthesia/sedation. Patient denies any oxygen use at home. Patient denies taking any diet/weight loss medications or blood thinners. Patient is not being treated for MRSA or C-diff. Patient is aware of our care-partner policy and PQAES-97 safety protocol. EMMI education sent via mychart COVID-19 vaccines completed per pt

## 2020-09-02 ENCOUNTER — Encounter: Payer: 59 | Admitting: Internal Medicine

## 2020-09-07 MED FILL — metFORMIN HCL ER 500 MG TB2: 500 | 30 days supply | Qty: 120 | Fill #0

## 2020-09-13 ENCOUNTER — Other Ambulatory Visit: Payer: Self-pay | Admitting: *Deleted

## 2020-09-13 MED ORDER — METFORMIN HCL ER 500 MG PO TB24: 1000.0000 mg | ORAL_TABLET | Freq: Two times a day (BID) | ORAL | 3 refills | Status: DC

## 2020-09-14 ENCOUNTER — Other Ambulatory Visit: Payer: Self-pay

## 2020-09-14 ENCOUNTER — Ambulatory Visit (AMBULATORY_SURGERY_CENTER): Payer: 59 | Admitting: Internal Medicine

## 2020-09-14 ENCOUNTER — Encounter: Payer: Self-pay | Admitting: Internal Medicine

## 2020-09-14 VITALS — BP 156/82 | HR 65 | Temp 98.6°F | Resp 18 | Ht 70.5 in | Wt 259.0 lb

## 2020-09-14 DIAGNOSIS — D123 Benign neoplasm of transverse colon: Secondary | ICD-10-CM

## 2020-09-14 DIAGNOSIS — E119 Type 2 diabetes mellitus without complications: Secondary | ICD-10-CM | POA: Diagnosis not present

## 2020-09-14 DIAGNOSIS — I10 Essential (primary) hypertension: Secondary | ICD-10-CM | POA: Diagnosis not present

## 2020-09-14 DIAGNOSIS — Z1211 Encounter for screening for malignant neoplasm of colon: Secondary | ICD-10-CM | POA: Diagnosis not present

## 2020-09-14 DIAGNOSIS — D122 Benign neoplasm of ascending colon: Secondary | ICD-10-CM

## 2020-09-14 MED ORDER — SODIUM CHLORIDE 0.9 % IV SOLN: 500.0000 mL | Freq: Once | INTRAVENOUS | Status: DC

## 2020-09-14 NOTE — Progress Notes (Signed)
Called to room to assist during endoscopic procedure.  Patient ID and intended procedure confirmed with present staff. Received instructions for my participation in the procedure from the performing physician.  

## 2020-09-14 NOTE — Progress Notes (Signed)
Right at end of procedure, pt noted to be gupping and started coughing.  I grabbed suction but couldnt't get anything out.  He did have a burst of clear liquid nut that was it.  Pt got very disoriented and combative.  Maura Crandall and Jess were in here to help keep pt safe until awake enough to orient himself to where he was.  Dr Henrene Pastor aware

## 2020-09-14 NOTE — Progress Notes (Signed)
Pt's states no medical or surgical changes since previsit or office visit.  V/s-sf  Check-in-AR

## 2020-09-14 NOTE — Op Note (Signed)
[facility] Patient Name: [place] Shellhammer Procedure Date: 09/14/2020 1:32 PM MRN: [id number] Endoscopist: Docia Chuck. Henrene Pastor , MD Age: 49 Referring MD:  Date of Birth: [birth year] Gender: Male Account #: [id number] Procedure:                Colonoscopy with cold snare polypectomy x 3 Indications:              Screening for colorectal malignant neoplasm Medicines:                Monitored Anesthesia Care Procedure:                Pre-Anesthesia Assessment:                           - Prior to the procedure, a History and Physical                            was performed, and patient medications and                            allergies were reviewed. The patient's tolerance of                            previous anesthesia was also reviewed. The risks                            and benefits of the procedure and the sedation                            options and risks were discussed with the patient.                            All questions were answered, and informed consent                            was obtained. Prior Anticoagulants: The patient has                            taken no previous anticoagulant or antiplatelet                            agents. ASA Grade Assessment: II - A patient with                            mild systemic disease. After reviewing the risks                            and benefits, the patient was deemed in                            satisfactory condition to undergo the procedure.                           After obtaining informed consent, the colonoscope  was passed under direct vision. Throughout the                            procedure, the patient's blood pressure, pulse, and                            oxygen saturations were monitored continuously. The                            Colonoscope was introduced through the anus and                            advanced to the the cecum, identified by                             appendiceal orifice and ileocecal valve. The                            ileocecal valve, appendiceal orifice, and rectum                            were photographed. The quality of the bowel                            preparation was excellent. The colonoscopy was                            performed without difficulty. The patient tolerated                            the procedure well. The bowel preparation used was                            SUPREP via split dose instruction. Scope In: 1:38:19 PM Scope Out: 1:56:07 PM Scope Withdrawal Time: 0 hours 15 minutes 3 seconds  Total Procedure Duration: 0 hours 17 minutes 48 seconds  Findings:                 Three polyps were found in the transverse colon and                            ascending colon. The polyps were 3 to 4 mm in size.                            These polyps were removed with a cold snare.                            Resection and retrieval were complete.                           A few small-mouthed diverticula were found in the                            sigmoid colon. A 1  cm incidental ascending colon                            lipoma noted.                           The exam was otherwise without abnormality on                            direct and retroflexion views. Complications:            No immediate complications. Estimated blood loss:                            None. Estimated Blood Loss:     Estimated blood loss: none. Impression:               - Three 3 to 4 mm polyps in the transverse colon                            and in the ascending colon, removed with a cold                            snare. Resected and retrieved.                           - Diverticulosis in the sigmoid colon. Right colon                            lipoma.                           - The examination was otherwise normal on direct                            and retroflexion views. Recommendation:           - Repeat colonoscopy  in 3 - 5 years for                            surveillance.                           - Patient has a contact number available for                            emergencies. The signs and symptoms of potential                            delayed complications were discussed with the                            patient. Return to normal activities tomorrow.                            Written discharge instructions were provided to the  patient.                           - Resume previous diet.                           - Continue present medications.                           - Await pathology results. Docia Chuck. Henrene Pastor, MD 09/14/2020 2:05:39 PM This report has been signed electronically.

## 2020-09-14 NOTE — Patient Instructions (Signed)
YOU HAD AN ENDOSCOPIC PROCEDURE TODAY AT THE [facility]:   Refer to the procedure report that was given to you for any specific questions about what was found during the examination.  If the procedure report does not answer your questions, please call your gastroenterologist to clarify.  If you requested that your care partner not be given the details of your procedure findings, then the procedure report has been included in a sealed envelope for you to review at your convenience later.  YOU SHOULD EXPECT: Some feelings of bloating in the abdomen. Passage of more gas than usual.  Walking can help get rid of the air that was put into your GI tract during the procedure and reduce the bloating. If you had a lower endoscopy (such as a colonoscopy or flexible sigmoidoscopy) you may notice spotting of blood in your stool or on the toilet paper. If you underwent a bowel prep for your procedure, you may not have a normal bowel movement for a few days.  Please Note:  You might notice some irritation and congestion in your nose or some drainage.  This is from the oxygen used during your procedure.  There is no need for concern and it should clear up in a day or so.  SYMPTOMS TO REPORT IMMEDIATELY:   Following lower endoscopy (colonoscopy or flexible sigmoidoscopy):  Excessive amounts of blood in the stool  Significant tenderness or worsening of abdominal pains  Swelling of the abdomen that is new, acute  Fever of 100F or higher  For urgent or emergent issues, a gastroenterologist can be reached at any hour by calling [phone]. Do not use MyChart messaging for urgent concerns.    DIET:  We do recommend a small meal at first, but then you may proceed to your regular diet.  Drink plenty of fluids but you should avoid alcoholic beverages for 24 hours.  ACTIVITY:  You should plan to take it easy for the rest of today and you should NOT DRIVE or use heavy machinery until tomorrow (because  of the sedation medicines used during the test).    FOLLOW UP: Our staff will call the number listed on your records 48-72 hours following your procedure to check on you and address any questions or concerns that you may have regarding the information given to you following your procedure. If we do not reach you, we will leave a message.  We will attempt to reach you two times.  During this call, we will ask if you have developed any symptoms of COVID 19. If you develop any symptoms (ie: fever, flu-like symptoms, shortness of breath, cough etc.) before then, please call [phone].  If you test positive for Covid 19 in the 2 weeks post procedure, please call and report this information to us.    If any biopsies were taken you will be contacted by phone or by letter within the next 1-3 weeks.  Please call us at [phone] if you have not heard about the biopsies in 3 weeks.    SIGNATURES/CONFIDENTIALITY: You and/or your care partner have signed paperwork which will be entered into your electronic medical record.  These signatures attest to the fact that that the information above on your After Visit Summary has been reviewed and is understood.  Full responsibility of the confidentiality of this discharge information lies with you and/or your care-partner. 

## 2020-09-16 ENCOUNTER — Telehealth: Payer: Self-pay

## 2020-09-16 ENCOUNTER — Telehealth: Payer: Self-pay | Admitting: *Deleted

## 2020-09-16 NOTE — Telephone Encounter (Signed)
Left message on follow up call. 

## 2020-09-16 NOTE — Telephone Encounter (Signed)
  Follow up Call-  Call back number 09/14/2020  Post procedure Call Back phone  # [phone]  Permission to leave phone message Yes  Some recent data might be hidden     Patient questions:  Do you have a fever, pain , or abdominal swelling? No. Pain Score  0 *  Have you tolerated food without any problems? Yes.    Have you been able to return to your normal activities? Yes.    Do you have any questions about your discharge instructions: Diet   No. Medications  No. Follow up visit  No.  Do you have questions or concerns about your Care? No.  Actions: * If pain score is 4 or above: No action needed, pain <4.  1. Have you developed a fever since your procedure? no  2.   Have you had an respiratory symptoms (SOB or cough) since your procedure? no  3.   Have you tested positive for COVID 19 since your procedure no  4.   Have you had any family members/close contacts diagnosed with the COVID 19 since your procedure?  no   If yes to any of these questions please route to Joylene John, RN and Joella Prince, RN

## 2020-09-20 ENCOUNTER — Encounter: Payer: Self-pay | Admitting: Internal Medicine

## 2020-10-01 MED FILL — metFORMIN HCL ER 500 MG TB2: 500 | 30 days supply | Qty: 120 | Fill #1

## 2020-10-07 MED FILL — ATORVASTATIN CALCIUM 10 MG: 10 | 90 days supply | Qty: 90 | Fill #2

## 2020-10-31 MED FILL — metFORMIN HCL ER 500 MG TB2: 500 | 30 days supply | Qty: 120 | Fill #2

## 2020-11-03 MED FILL — LOSARTAN-HCTZ 50-12.5 MG TA: 50-12.5 | 90 days supply | Qty: 90 | Fill #1

## 2020-11-30 MED FILL — metFORMIN HCL ER 500 MG TB2: 500 | 30 days supply | Qty: 120 | Fill #3

## 2020-12-14 DIAGNOSIS — G4489 Other headache syndrome: Secondary | ICD-10-CM | POA: Diagnosis not present

## 2020-12-14 DIAGNOSIS — M9902 Segmental and somatic dysfunction of thoracic region: Secondary | ICD-10-CM | POA: Diagnosis not present

## 2020-12-14 DIAGNOSIS — M5385 Other specified dorsopathies, thoracolumbar region: Secondary | ICD-10-CM | POA: Diagnosis not present

## 2020-12-14 DIAGNOSIS — M9903 Segmental and somatic dysfunction of lumbar region: Secondary | ICD-10-CM | POA: Diagnosis not present

## 2020-12-16 DIAGNOSIS — I10 Essential (primary) hypertension: Secondary | ICD-10-CM | POA: Diagnosis not present

## 2020-12-16 DIAGNOSIS — J00 Acute nasopharyngitis [common cold]: Secondary | ICD-10-CM | POA: Diagnosis not present

## 2020-12-16 DIAGNOSIS — R0981 Nasal congestion: Secondary | ICD-10-CM | POA: Diagnosis not present

## 2020-12-16 DIAGNOSIS — J029 Acute pharyngitis, unspecified: Secondary | ICD-10-CM | POA: Diagnosis not present

## 2020-12-20 DIAGNOSIS — R059 Cough, unspecified: Secondary | ICD-10-CM | POA: Diagnosis not present

## 2020-12-20 DIAGNOSIS — U071 COVID-19: Secondary | ICD-10-CM | POA: Diagnosis not present

## 2020-12-20 DIAGNOSIS — Z03818 Encounter for observation for suspected exposure to other biological agents ruled out: Secondary | ICD-10-CM | POA: Diagnosis not present

## 2020-12-30 MED FILL — metFORMIN HCL ER 500 MG TB2: 500 | 30 days supply | Qty: 120 | Fill #4

## 2021-01-05 MED FILL — ATORVASTATIN CALCIUM 10 MG: 10 | 90 days supply | Qty: 90 | Fill #3

## 2021-01-29 MED FILL — metFORMIN HCL ER 500 MG TB2: 500 | 30 days supply | Qty: 120 | Fill #5

## 2021-02-01 MED FILL — LOSARTAN-HCTZ 50-12.5 MG TA: 50-12.5 | 30 days supply | Qty: 30 | Fill #2

## 2021-03-10 ENCOUNTER — Other Ambulatory Visit (HOSPITAL_BASED_OUTPATIENT_CLINIC_OR_DEPARTMENT_OTHER): Payer: Self-pay

## 2021-03-24 ENCOUNTER — Other Ambulatory Visit (HOSPITAL_COMMUNITY): Payer: Self-pay

## 2021-03-24 MED ORDER — METFORMIN HCL ER 500 MG PO TB24
500.0000 mg | ORAL_TABLET | Freq: Two times a day (BID) | ORAL | 3 refills | Status: DC
  Filled 2021-03-24: qty 360, 180d supply, fill #0

## 2021-03-25 ENCOUNTER — Other Ambulatory Visit (HOSPITAL_COMMUNITY): Payer: Self-pay

## 2021-03-25 MED FILL — Metformin HCl Tab ER 24HR 500 MG: ORAL | 30 days supply | Qty: 120 | Fill #0 | Status: AC

## 2021-03-31 ENCOUNTER — Other Ambulatory Visit (HOSPITAL_COMMUNITY): Payer: Self-pay

## 2021-04-21 ENCOUNTER — Other Ambulatory Visit (HOSPITAL_COMMUNITY): Payer: Self-pay

## 2021-04-21 MED FILL — Metformin HCl Tab ER 24HR 500 MG: ORAL | 30 days supply | Qty: 120 | Fill #1 | Status: AC

## 2021-05-05 ENCOUNTER — Other Ambulatory Visit (HOSPITAL_COMMUNITY): Payer: Self-pay

## 2021-05-05 MED FILL — Metformin HCl Tab ER 24HR 500 MG: ORAL | 60 days supply | Qty: 240 | Fill #2 | Status: AC

## 2021-05-05 MED FILL — Losartan Potassium & Hydrochlorothiazide Tab 50-12.5 MG: ORAL | 60 days supply | Qty: 60 | Fill #0 | Status: AC

## 2021-05-05 MED FILL — Atorvastatin Calcium Tab 10 MG (Base Equivalent): ORAL | 60 days supply | Qty: 60 | Fill #0 | Status: AC

## 2021-07-06 ENCOUNTER — Other Ambulatory Visit (HOSPITAL_COMMUNITY): Payer: Self-pay

## 2021-07-06 ENCOUNTER — Telehealth: Payer: Self-pay | Admitting: Family Medicine

## 2021-07-06 MED ORDER — ATORVASTATIN CALCIUM 10 MG PO TABS
ORAL_TABLET | Freq: Every day | ORAL | 0 refills | Status: DC
  Filled 2021-07-06: qty 90, 90d supply, fill #0

## 2021-07-06 MED ORDER — METFORMIN HCL ER 500 MG PO TB24
ORAL_TABLET | ORAL | 0 refills | Status: DC
  Filled 2021-07-06: qty 360, 90d supply, fill #0

## 2021-07-06 MED FILL — Losartan Potassium & Hydrochlorothiazide Tab 50-12.5 MG: ORAL | 60 days supply | Qty: 60 | Fill #1 | Status: AC

## 2021-07-06 NOTE — Telephone Encounter (Signed)
Please schedule CPE with fasting labs prior with Dr. Bedsole.  

## 2021-07-06 NOTE — Telephone Encounter (Signed)
Left voice message to call the office  

## 2021-07-07 NOTE — Telephone Encounter (Signed)
Appointment scheduled.

## 2021-08-10 ENCOUNTER — Telehealth: Payer: Self-pay | Admitting: Family Medicine

## 2021-08-10 DIAGNOSIS — IMO0002 Reserved for concepts with insufficient information to code with codable children: Secondary | ICD-10-CM

## 2021-08-10 DIAGNOSIS — E1165 Type 2 diabetes mellitus with hyperglycemia: Secondary | ICD-10-CM

## 2021-08-10 NOTE — Telephone Encounter (Signed)
-----   Message from Cloyd Stagers, RT sent at 07/25/2021 10:10 AM EDT ----- Regarding: Lab Orders for Thursday 8.25.2022 Please place lab orders for Thursday 8.25.2022, office visit for physical on Friday 9.2.2022 Thank you, Dyke Maes RT(R)

## 2021-08-11 ENCOUNTER — Other Ambulatory Visit: Payer: Self-pay

## 2021-08-11 ENCOUNTER — Other Ambulatory Visit (INDEPENDENT_AMBULATORY_CARE_PROVIDER_SITE_OTHER): Payer: 59

## 2021-08-11 DIAGNOSIS — IMO0002 Reserved for concepts with insufficient information to code with codable children: Secondary | ICD-10-CM

## 2021-08-11 DIAGNOSIS — E118 Type 2 diabetes mellitus with unspecified complications: Secondary | ICD-10-CM | POA: Diagnosis not present

## 2021-08-11 DIAGNOSIS — E1165 Type 2 diabetes mellitus with hyperglycemia: Secondary | ICD-10-CM

## 2021-08-11 LAB — HEMOGLOBIN A1C: Hgb A1c MFr Bld: 6.2 % (ref 4.6–6.5)

## 2021-08-11 LAB — COMPREHENSIVE METABOLIC PANEL
ALT: 25 U/L (ref 0–53)
AST: 22 U/L (ref 0–37)
Albumin: 4.2 g/dL (ref 3.5–5.2)
Alkaline Phosphatase: 64 U/L (ref 39–117)
BUN: 14 mg/dL (ref 6–23)
CO2: 29 mEq/L (ref 19–32)
Calcium: 9.3 mg/dL (ref 8.4–10.5)
Chloride: 101 mEq/L (ref 96–112)
Creatinine, Ser: 1.03 mg/dL (ref 0.40–1.50)
GFR: 84.56 mL/min (ref >60.00)
Glucose, Bld: 127 mg/dL — ABNORMAL HIGH (ref 70–99)
Potassium: 3.9 mEq/L (ref 3.5–5.1)
Sodium: 138 mEq/L (ref 135–145)
Total Bilirubin: 1.9 mg/dL — ABNORMAL HIGH (ref 0.2–1.2)
Total Protein: 6.6 g/dL (ref 6.0–8.3)

## 2021-08-11 LAB — LIPID PANEL
Cholesterol: 87 mg/dL (ref 0–200)
HDL: 25.2 mg/dL — ABNORMAL LOW (ref >39.00)
LDL Cholesterol: 28 mg/dL (ref 0–99)
NonHDL: 61.82
Total CHOL/HDL Ratio: 3
Triglycerides: 167 mg/dL — ABNORMAL HIGH (ref 0.0–149.0)
VLDL: 33.4 mg/dL (ref 0.0–40.0)

## 2021-08-11 NOTE — Progress Notes (Signed)
No critical labs need to be addressed urgently. We will discuss labs in detail at upcoming office visit.   

## 2021-08-19 ENCOUNTER — Other Ambulatory Visit (HOSPITAL_COMMUNITY): Payer: Self-pay

## 2021-08-19 ENCOUNTER — Ambulatory Visit (INDEPENDENT_AMBULATORY_CARE_PROVIDER_SITE_OTHER): Payer: 59 | Admitting: Family Medicine

## 2021-08-19 ENCOUNTER — Encounter: Payer: Self-pay | Admitting: Family Medicine

## 2021-08-19 ENCOUNTER — Other Ambulatory Visit: Payer: Self-pay

## 2021-08-19 VITALS — BP 120/76 | HR 65 | Temp 97.8°F | Ht 70.5 in | Wt 248.0 lb

## 2021-08-19 DIAGNOSIS — I152 Hypertension secondary to endocrine disorders: Secondary | ICD-10-CM | POA: Diagnosis not present

## 2021-08-19 DIAGNOSIS — E1165 Type 2 diabetes mellitus with hyperglycemia: Secondary | ICD-10-CM

## 2021-08-19 DIAGNOSIS — K760 Fatty (change of) liver, not elsewhere classified: Secondary | ICD-10-CM

## 2021-08-19 DIAGNOSIS — E785 Hyperlipidemia, unspecified: Secondary | ICD-10-CM

## 2021-08-19 DIAGNOSIS — Z125 Encounter for screening for malignant neoplasm of prostate: Secondary | ICD-10-CM | POA: Diagnosis not present

## 2021-08-19 DIAGNOSIS — E1169 Type 2 diabetes mellitus with other specified complication: Secondary | ICD-10-CM | POA: Diagnosis not present

## 2021-08-19 DIAGNOSIS — E1159 Type 2 diabetes mellitus with other circulatory complications: Secondary | ICD-10-CM | POA: Diagnosis not present

## 2021-08-19 DIAGNOSIS — Z0000 Encounter for general adult medical examination without abnormal findings: Secondary | ICD-10-CM

## 2021-08-19 DIAGNOSIS — IMO0002 Reserved for concepts with insufficient information to code with codable children: Secondary | ICD-10-CM

## 2021-08-19 DIAGNOSIS — E118 Type 2 diabetes mellitus with unspecified complications: Secondary | ICD-10-CM

## 2021-08-19 LAB — HM DIABETES FOOT EXAM

## 2021-08-19 MED ORDER — LOSARTAN POTASSIUM-HCTZ 50-12.5 MG PO TABS
1.0000 | ORAL_TABLET | Freq: Every day | ORAL | 3 refills | Status: DC
  Filled 2021-08-19: qty 90, 90d supply, fill #0
  Filled 2021-12-19: qty 90, 90d supply, fill #1
  Filled 2022-01-28 – 2022-03-24 (×3): qty 90, 90d supply, fill #2
  Filled 2022-05-30 – 2022-07-05 (×2): qty 90, 90d supply, fill #0

## 2021-08-19 MED ORDER — ATORVASTATIN CALCIUM 10 MG PO TABS
10.0000 mg | ORAL_TABLET | ORAL | 3 refills | Status: DC
  Filled 2021-08-19 – 2021-11-18 (×2): qty 36, 84d supply, fill #0
  Filled 2022-01-28: qty 36, 84d supply, fill #1
  Filled 2022-03-22 – 2022-04-22 (×2): qty 36, 84d supply, fill #2
  Filled 2022-05-30 – 2022-08-03 (×2): qty 36, 84d supply, fill #0

## 2021-08-19 MED ORDER — METFORMIN HCL ER 500 MG PO TB24
ORAL_TABLET | ORAL | 3 refills | Status: DC
  Filled 2021-08-19: qty 180, 60d supply, fill #0
  Filled 2021-11-18: qty 360, 90d supply, fill #0
  Filled 2022-01-28: qty 360, 90d supply, fill #1

## 2021-08-19 NOTE — Assessment & Plan Note (Signed)
Stable, chronic.  Continue current medication.  At goal on losartan HCTZ 50/12.5 mg daily 

## 2021-08-19 NOTE — Assessment & Plan Note (Signed)
Stable, chronic.  Continue current medication.   Decrease atorvastatin to 3 days a week as very low HDL and LDL.

## 2021-08-19 NOTE — Assessment & Plan Note (Signed)
Stable, chronic.  Continue current medication. Excellent improvement in control.. A1C at goal on max metformin

## 2021-08-19 NOTE — Patient Instructions (Addendum)
Decrease atorvastatin to three days a week.  Keep up excellent work on lifestyle changes... increase activity and keep up the weight loss

## 2021-08-19 NOTE — Assessment & Plan Note (Signed)
Bilirubin elevated but AST and ALT normal.. continue to follow

## 2021-08-19 NOTE — Progress Notes (Signed)
Patient ID: Philip Shaffer, male    DOB: 05-07-1971, 50 y.o.   MRN: [id number]  This visit was conducted in person.  BP 120/76   Pulse 65   Temp 97.8 F (36.6 C) (Temporal)   Ht 5' 10.5" (1.791 m)   Wt 248 lb (112.5 kg)   SpO2 97%   BMI 35.08 kg/m    CC:  Chief Complaint  Patient presents with   Annual Exam    Subjective:   HPI: Philip Shaffer is a 50 y.o. male presenting on 08/19/2021 for Annual Exam  Diabetes:   At goal on low dose metformin 500 mg 2 tabs twice  daily. Lab Results  Component Value Date   HGBA1C 6.2 08/11/2021  Using medications without difficulties: none Hypoglycemic episodes:none Hyperglycemic episodes: no Feet problems:none Blood Sugars averaging:not checking eye exam within last year: yes  Elevated Cholesterol:  LDL at goal on atorvastatin 10 mg daily Lab Results  Component Value Date   CHOL 87 08/11/2021   HDL 25.20 (L) 08/11/2021   LDLCALC 28 08/11/2021   LDLDIRECT 81.0 06/15/2020   TRIG 167.0 (H) 08/11/2021   CHOLHDL 3 08/11/2021  Using medications without problems: Muscle aches:  Diet compliance: moderate Exercise:1-2 times a week basketball Other complaints:   10 lb weight loss Wt Readings from Last 3 Encounters:  08/19/21 248 lb (112.5 kg)  09/14/20 259 lb (117.5 kg)  08/18/20 259 lb (117.5 kg)    Hypertension:   At goal on losartan HCTZ 50/12.5 mg daily BP Readings from Last 3 Encounters:  08/19/21 120/76  09/14/20 (!) 156/82  06/15/20 132/84  Using medication without problems or lightheadedness:  none Chest pain with exertion: none Edema:none Short of breath:none Average home BPs: not checking Other issues:    Relevant past medical, surgical, family and social history reviewed and updated as indicated. Interim medical history since our last visit reviewed. Allergies and medications reviewed and updated. Outpatient Medications Prior to Visit  Medication Sig Dispense Refill   atorvastatin (LIPITOR) 10  MG tablet TAKE 1 TABLET BY MOUTH ONCE DAILY 90 tablet 0   losartan-hydrochlorothiazide (HYZAAR) 50-12.5 MG tablet TAKE 1 TABLET BY MOUTH ONCE DAILY 90 tablet 3   metFORMIN (GLUCOPHAGE-XR) 500 MG 24 hr tablet TAKE 2 TABLETS BY MOUTH IN THE MORNING AND AT BEDTIME. 360 tablet 0   No facility-administered medications prior to visit.     Per HPI unless specifically indicated in ROS section below Review of Systems Objective:  BP 120/76   Pulse 65   Temp 97.8 F (36.6 C) (Temporal)   Ht 5' 10.5" (1.791 m)   Wt 248 lb (112.5 kg)   SpO2 97%   BMI 35.08 kg/m   Wt Readings from Last 3 Encounters:  08/19/21 248 lb (112.5 kg)  09/14/20 259 lb (117.5 kg)  08/18/20 259 lb (117.5 kg)      Physical Exam    Diabetic foot exam: Normal inspection No skin breakdown No calluses  Normal DP pulses Normal sensation to light touch and monofilament Nails normal  Results for orders placed or performed in visit on 08/11/21  Comprehensive metabolic panel  Result Value Ref Range   Sodium 138 135 - 145 mEq/L   Potassium 3.9 3.5 - 5.1 mEq/L   Chloride 101 96 - 112 mEq/L   CO2 29 19 - 32 mEq/L   Glucose, Bld 127 (H) 70 - 99 mg/dL   BUN 14 6 - 23 mg/dL   Creatinine, Ser 1.03 0.40 -  1.50 mg/dL   Total Bilirubin 1.9 (H) 0.2 - 1.2 mg/dL   Alkaline Phosphatase 64 39 - 117 U/L   AST 22 0 - 37 U/L   ALT 25 0 - 53 U/L   Total Protein 6.6 6.0 - 8.3 g/dL   Albumin 4.2 3.5 - 5.2 g/dL   GFR 84.56 >60.00 mL/min   Calcium 9.3 8.4 - 10.5 mg/dL  Lipid panel  Result Value Ref Range   Cholesterol 87 0 - 200 mg/dL   Triglycerides 167.0 (H) 0.0 - 149.0 mg/dL   HDL 25.20 (L) >39.00 mg/dL   VLDL 33.4 0.0 - 40.0 mg/dL   LDL Cholesterol 28 0 - 99 mg/dL   Total CHOL/HDL Ratio 3    NonHDL 61.82   Hemoglobin A1c  Result Value Ref Range   Hgb A1c MFr Bld 6.2 4.6 - 6.5 %    This visit occurred during the SARS-CoV-2 public health emergency.  Safety protocols were in place, including screening questions prior to  the visit, additional usage of staff PPE, and extensive cleaning of exam room while observing appropriate contact time as indicated for disinfecting solutions.   COVID 19 screen:  No recent travel or known exposure to COVID19 The patient denies respiratory symptoms of COVID 19 at this time. The importance of social distancing was discussed today.   Assessment and Plan   The patient's preventative maintenance and recommended screening tests for an annual wellness exam were reviewed in full today. Brought up to date unless services declined.  Counselled on the importance of diet, exercise, and its role in overall health and mortality. The patient's FH and SH was reviewed, including their home life, tobacco status, and drug and alcohol status.   Vaccines: Due for flu, consider shingrix, S/P COVID9 vaccine x 2,  Prostate Cancer Screen: Start age 45  Due Colon Cancer Screen: 09/14/2020 plan repeat in 10 years.      Smoking Status: none, occ cigar ETOH/ drug [id number]  Hep C:    done  HIV screen:  refused   Problem List Items Addressed This Visit     Diabetes mellitus type 2, uncontrolled, with complications (HCC)    Stable, chronic.  Continue current medication. Excellent improvement in control.. A1C at goal on max metformin      Relevant Medications   metFORMIN (GLUCOPHAGE-XR) 500 MG 24 hr tablet   losartan-hydrochlorothiazide (HYZAAR) 50-12.5 MG tablet   atorvastatin (LIPITOR) 10 MG tablet   Other Relevant Orders   Hemoglobin A1c   Lipid panel   Comprehensive metabolic panel   Fatty liver    Bilirubin elevated but AST and ALT normal.. continue to follow      Hyperlipidemia associated with type 2 diabetes mellitus (HCC)    Stable, chronic.  Continue current medication.   Decrease atorvastatin to 3 days a week as very low HDL and LDL.      Relevant Medications   metFORMIN (GLUCOPHAGE-XR) 500 MG 24 hr tablet   losartan-hydrochlorothiazide (HYZAAR) 50-12.5 MG tablet    atorvastatin (LIPITOR) 10 MG tablet   Hypertension associated with diabetes (HCC)    Stable, chronic.  Continue current medication.   At goal on losartan HCTZ 50/12.5 mg daily      Relevant Medications   metFORMIN (GLUCOPHAGE-XR) 500 MG 24 hr tablet   losartan-hydrochlorothiazide (HYZAAR) 50-12.5 MG tablet   atorvastatin (LIPITOR) 10 MG tablet   Other Visit Diagnoses     Routine general medical examination at a health care facility    -  Primary   Prostate cancer screening       Relevant Orders   PSA       [name], MD

## 2021-08-23 ENCOUNTER — Other Ambulatory Visit (HOSPITAL_COMMUNITY): Payer: Self-pay

## 2021-11-14 DIAGNOSIS — G4489 Other headache syndrome: Secondary | ICD-10-CM | POA: Diagnosis not present

## 2021-11-14 DIAGNOSIS — M9901 Segmental and somatic dysfunction of cervical region: Secondary | ICD-10-CM | POA: Diagnosis not present

## 2021-11-14 DIAGNOSIS — M5382 Other specified dorsopathies, cervical region: Secondary | ICD-10-CM | POA: Diagnosis not present

## 2021-11-18 ENCOUNTER — Other Ambulatory Visit (HOSPITAL_COMMUNITY): Payer: Self-pay

## 2021-12-19 ENCOUNTER — Other Ambulatory Visit (HOSPITAL_COMMUNITY): Payer: Self-pay

## 2022-01-28 ENCOUNTER — Other Ambulatory Visit (HOSPITAL_COMMUNITY): Payer: Self-pay

## 2022-01-30 ENCOUNTER — Other Ambulatory Visit (HOSPITAL_COMMUNITY): Payer: Self-pay

## 2022-03-22 ENCOUNTER — Other Ambulatory Visit: Payer: Self-pay | Admitting: Family Medicine

## 2022-03-22 ENCOUNTER — Other Ambulatory Visit (HOSPITAL_COMMUNITY): Payer: Self-pay

## 2022-03-24 ENCOUNTER — Other Ambulatory Visit (HOSPITAL_COMMUNITY): Payer: Self-pay

## 2022-04-22 ENCOUNTER — Other Ambulatory Visit (HOSPITAL_COMMUNITY): Payer: Self-pay

## 2022-05-30 ENCOUNTER — Other Ambulatory Visit: Payer: Self-pay

## 2022-05-30 ENCOUNTER — Telehealth: Payer: Self-pay | Admitting: Family Medicine

## 2022-05-30 MED ORDER — METFORMIN HCL ER 500 MG PO TB24
ORAL_TABLET | ORAL | 0 refills | Status: DC
  Filled 2022-05-30: qty 180, 45d supply, fill #0

## 2022-05-30 NOTE — Telephone Encounter (Signed)
Please schedule Diabetes follow up with fasting labs prior.  He was suppose to follow up back in March.

## 2022-05-31 NOTE — Telephone Encounter (Signed)
Lvm for pt to call back and schedule.  

## 2022-06-13 DIAGNOSIS — M9901 Segmental and somatic dysfunction of cervical region: Secondary | ICD-10-CM | POA: Diagnosis not present

## 2022-06-13 DIAGNOSIS — G4489 Other headache syndrome: Secondary | ICD-10-CM | POA: Diagnosis not present

## 2022-06-13 DIAGNOSIS — M5382 Other specified dorsopathies, cervical region: Secondary | ICD-10-CM | POA: Diagnosis not present

## 2022-07-03 ENCOUNTER — Other Ambulatory Visit (HOSPITAL_COMMUNITY): Payer: Self-pay

## 2022-07-05 ENCOUNTER — Other Ambulatory Visit: Payer: Self-pay

## 2022-08-03 ENCOUNTER — Other Ambulatory Visit: Payer: Self-pay | Admitting: Family Medicine

## 2022-08-03 ENCOUNTER — Other Ambulatory Visit: Payer: Self-pay

## 2022-08-03 NOTE — Telephone Encounter (Signed)
Called and lvm for patient to call back to make an appt for refills

## 2022-08-06 ENCOUNTER — Other Ambulatory Visit: Payer: Self-pay

## 2022-08-06 ENCOUNTER — Other Ambulatory Visit: Payer: Self-pay | Admitting: Family Medicine

## 2022-08-07 NOTE — Telephone Encounter (Signed)
Called and LVM for patient to call and schedule 

## 2022-08-07 NOTE — Telephone Encounter (Signed)
Please call patient and schedule follow-up office visit. Send back to CMA for refill after appointment is scheduled

## 2022-08-07 NOTE — Telephone Encounter (Signed)
LVM for patient to call back and schedule

## 2022-08-08 ENCOUNTER — Other Ambulatory Visit: Payer: Self-pay

## 2022-08-18 ENCOUNTER — Other Ambulatory Visit: Payer: Self-pay

## 2022-09-05 ENCOUNTER — Telehealth: Payer: Self-pay | Admitting: Family Medicine

## 2022-09-06 ENCOUNTER — Other Ambulatory Visit: Payer: Self-pay

## 2022-09-06 MED ORDER — METFORMIN HCL ER 500 MG PO TB24
ORAL_TABLET | ORAL | 0 refills | Status: DC
  Filled 2022-09-06: qty 60, 15d supply, fill #0

## 2022-09-06 MED ORDER — ATORVASTATIN CALCIUM 10 MG PO TABS
10.0000 mg | ORAL_TABLET | ORAL | 0 refills | Status: DC
  Filled 2022-09-06: qty 15, 35d supply, fill #0

## 2022-09-06 NOTE — Telephone Encounter (Signed)
Please schedule CPE with fasting labs prior with Dr. Bedsole.  

## 2022-09-06 NOTE — Telephone Encounter (Signed)
Called pt, scheduled cpe for 09/21/22

## 2022-09-15 ENCOUNTER — Telehealth: Payer: Self-pay | Admitting: Family Medicine

## 2022-09-15 DIAGNOSIS — E1169 Type 2 diabetes mellitus with other specified complication: Secondary | ICD-10-CM

## 2022-09-15 DIAGNOSIS — E1165 Type 2 diabetes mellitus with hyperglycemia: Secondary | ICD-10-CM

## 2022-09-15 NOTE — Telephone Encounter (Signed)
-----   Message from Ellamae Sia sent at 09/06/2022  2:51 PM EDT ----- Regarding: Lab orders for Monday, 10.2.23 Patient is scheduled for CPX labs, please order future labs, Thanks , Karna Christmas

## 2022-09-18 ENCOUNTER — Other Ambulatory Visit (INDEPENDENT_AMBULATORY_CARE_PROVIDER_SITE_OTHER): Payer: 59

## 2022-09-18 DIAGNOSIS — E1165 Type 2 diabetes mellitus with hyperglycemia: Secondary | ICD-10-CM | POA: Diagnosis not present

## 2022-09-18 DIAGNOSIS — E785 Hyperlipidemia, unspecified: Secondary | ICD-10-CM | POA: Diagnosis not present

## 2022-09-18 DIAGNOSIS — E1169 Type 2 diabetes mellitus with other specified complication: Secondary | ICD-10-CM | POA: Diagnosis not present

## 2022-09-19 LAB — COMPREHENSIVE METABOLIC PANEL
ALT: 54 U/L — ABNORMAL HIGH (ref 0–53)
AST: 36 U/L (ref 0–37)
Albumin: 4.8 g/dL (ref 3.5–5.2)
Alkaline Phosphatase: 77 U/L (ref 39–117)
BUN: 14 mg/dL (ref 6–23)
CO2: 26 mEq/L (ref 19–32)
Calcium: 10 mg/dL (ref 8.4–10.5)
Chloride: 98 mEq/L (ref 96–112)
Creatinine, Ser: 1 mg/dL (ref 0.40–1.50)
GFR: 86.94 mL/min (ref >60.00)
Glucose, Bld: 175 mg/dL — ABNORMAL HIGH (ref 70–99)
Potassium: 3.7 mEq/L (ref 3.5–5.1)
Sodium: 136 mEq/L (ref 135–145)
Total Bilirubin: 1.4 mg/dL — ABNORMAL HIGH (ref 0.2–1.2)
Total Protein: 7.2 g/dL (ref 6.0–8.3)

## 2022-09-19 LAB — LIPID PANEL
Cholesterol: 159 mg/dL (ref 0–200)
HDL: 27.6 mg/dL — ABNORMAL LOW (ref >39.00)
Total CHOL/HDL Ratio: 6
Triglycerides: 485 mg/dL — ABNORMAL HIGH (ref 0.0–149.0)

## 2022-09-19 LAB — LDL CHOLESTEROL, DIRECT: Direct LDL: 74 mg/dL

## 2022-09-19 LAB — HEMOGLOBIN A1C: Hgb A1c MFr Bld: 8.2 % — ABNORMAL HIGH (ref 4.6–6.5)

## 2022-09-19 NOTE — Progress Notes (Signed)
No critical labs need to be addressed urgently. We will discuss labs in detail at upcoming office visit.   

## 2022-09-21 ENCOUNTER — Other Ambulatory Visit: Payer: Self-pay | Admitting: Family Medicine

## 2022-09-21 ENCOUNTER — Ambulatory Visit (INDEPENDENT_AMBULATORY_CARE_PROVIDER_SITE_OTHER): Payer: 59 | Admitting: Family Medicine

## 2022-09-21 ENCOUNTER — Encounter: Payer: Self-pay | Admitting: Family Medicine

## 2022-09-21 ENCOUNTER — Other Ambulatory Visit: Payer: Self-pay

## 2022-09-21 VITALS — BP 142/90 | HR 72 | Temp 97.8°F | Ht 70.0 in | Wt 259.0 lb

## 2022-09-21 DIAGNOSIS — E1159 Type 2 diabetes mellitus with other circulatory complications: Secondary | ICD-10-CM

## 2022-09-21 DIAGNOSIS — K760 Fatty (change of) liver, not elsewhere classified: Secondary | ICD-10-CM | POA: Diagnosis not present

## 2022-09-21 DIAGNOSIS — Z7189 Other specified counseling: Secondary | ICD-10-CM | POA: Diagnosis not present

## 2022-09-21 DIAGNOSIS — Z23 Encounter for immunization: Secondary | ICD-10-CM

## 2022-09-21 DIAGNOSIS — M7712 Lateral epicondylitis, left elbow: Secondary | ICD-10-CM

## 2022-09-21 DIAGNOSIS — Z0000 Encounter for general adult medical examination without abnormal findings: Secondary | ICD-10-CM | POA: Diagnosis not present

## 2022-09-21 DIAGNOSIS — Z125 Encounter for screening for malignant neoplasm of prostate: Secondary | ICD-10-CM

## 2022-09-21 DIAGNOSIS — E785 Hyperlipidemia, unspecified: Secondary | ICD-10-CM

## 2022-09-21 DIAGNOSIS — E1165 Type 2 diabetes mellitus with hyperglycemia: Secondary | ICD-10-CM

## 2022-09-21 DIAGNOSIS — I152 Hypertension secondary to endocrine disorders: Secondary | ICD-10-CM

## 2022-09-21 DIAGNOSIS — E1169 Type 2 diabetes mellitus with other specified complication: Secondary | ICD-10-CM

## 2022-09-21 LAB — HM DIABETES FOOT EXAM

## 2022-09-21 MED ORDER — METFORMIN HCL ER 500 MG PO TB24
ORAL_TABLET | ORAL | 1 refills | Status: DC
  Filled 2022-09-21: qty 360, 90d supply, fill #0
  Filled 2022-12-18: qty 360, 90d supply, fill #1

## 2022-09-21 MED ORDER — OZEMPIC (0.25 OR 0.5 MG/DOSE) 2 MG/3ML ~~LOC~~ SOPN
0.2500 mg | PEN_INJECTOR | SUBCUTANEOUS | 11 refills | Status: DC
  Filled 2022-09-21: qty 3, 28d supply, fill #0
  Filled 2022-10-17: qty 3, 28d supply, fill #1
  Filled 2022-12-18: qty 3, 28d supply, fill #2
  Filled 2023-01-23 – 2023-02-15 (×3): qty 3, 28d supply, fill #3
  Filled 2023-03-23: qty 3, 28d supply, fill #4
  Filled 2023-04-23: qty 3, 28d supply, fill #5
  Filled 2023-06-26: qty 3, 28d supply, fill #6
  Filled 2023-08-07: qty 3, 28d supply, fill #7
  Filled 2023-09-20: qty 3, 28d supply, fill #8

## 2022-09-21 MED ORDER — ATORVASTATIN CALCIUM 10 MG PO TABS
10.0000 mg | ORAL_TABLET | ORAL | 3 refills | Status: DC
  Filled 2022-09-21: qty 40, 94d supply, fill #0
  Filled 2022-10-11: qty 36, 84d supply, fill #0
  Filled 2022-12-18 – 2022-12-19 (×2): qty 36, 84d supply, fill #1
  Filled ????-??-??: fill #1

## 2022-09-21 MED ORDER — LOSARTAN POTASSIUM-HCTZ 50-12.5 MG PO TABS
1.0000 | ORAL_TABLET | Freq: Every day | ORAL | 3 refills | Status: DC
  Filled 2022-09-21: qty 90, 90d supply, fill #0
  Filled 2022-12-18: qty 90, 90d supply, fill #1
  Filled 2023-03-23: qty 90, 90d supply, fill #2
  Filled 2023-06-26: qty 90, 90d supply, fill #3

## 2022-09-21 NOTE — Progress Notes (Addendum)
Patient ID: Philip Shaffer, male    DOB: Jun 01, 1971, 51 y.o.   MRN: [id number]  This visit was conducted in person.  BP (!) 142/90   Pulse 72   Temp 97.8 F (36.6 C) (Temporal)   Ht '5\' 10"'$  (1.778 m)   Wt 259 lb (117.5 kg)   SpO2 98%   BMI 37.16 kg/m    CC:  Chief Complaint  Patient presents with   Employment Physical    Left elbow pain X 1 month     Subjective:   HPI: Philip Shaffer is a 51 y.o. male presenting on 09/21/2022 for Employment Physical (Left elbow pain X 1 month )  The patient presents for complete physical and review of chronic health problems. He/She also has the following acute concerns today: left elbow pain x 1 month.  Lateral elbow.  Has been wearing a brace.  Hurts more  with gripping hand,  grabbing and bending.. picking No redness, no swelling.  No known fall or injury... has been hurting since before he has been moving.   Elevated Cholesterol: LDL at goal on atorvastatin 10 mg 3 days a week. Living in camper eating out. Lab Results  Component Value Date   CHOL 159 09/18/2022   HDL 27.60 (L) 09/18/2022   LDLCALC 28 08/11/2021   LDLDIRECT 74.0 09/18/2022   TRIG (H) 09/18/2022    485.0 Triglyceride is over 400; calculations on Lipids are invalid.   CHOLHDL 6 09/18/2022  Using medications without problems: none Muscle aches:  none Diet compliance: poor  Exercise: plans to get back to exercise Other complaints:  Hypertension:  Blood pressure borderline control on losartan hydrochlorothiazide 50/12.5 mg daily BP Readings from Last 3 Encounters:  09/21/22 (!) 142/90  08/19/21 120/76  09/14/20 (!) 156/82  Using medication without problems or lightheadedness:  Chest pain with exertion: Edema: Short of breath: Average home BPs: Other issues:  Diabetes: Inadequate control on metformin 500 mg XR 2 tablets in the morning and 2 at night Lab Results  Component Value Date   HGBA1C 8.2 (H) 09/18/2022  Using medications without  difficulties: Hypoglycemic episodes: Hyperglycemic episodes: Feet problems: no ulcers Blood Sugars averaging: eye exam within last year: yes The 10-year ASCVD risk score (Arnett DK, et al., 2019) is: 26.9%   Values used to calculate the score:     Age: 78 years     Sex: Male     Is Non-Hispanic African American: No     Diabetic: Yes     Tobacco smoker: Yes     Systolic Blood Pressure: 270 mmHg     Is BP treated: Yes     HDL Cholesterol: 27.6 mg/dL     Total Cholesterol: 159 mg/dL  Wt Readings from Last 3 Encounters:  09/21/22 259 lb (117.5 kg)  08/19/21 248 lb (112.5 kg)  09/14/20 259 lb (117.5 kg)    Interested in cardiac risk counseling     Relevant past medical, surgical, family and social history reviewed and updated as indicated. Interim medical history since our last visit reviewed. Allergies and medications reviewed and updated. Outpatient Medications Prior to Visit  Medication Sig Dispense Refill   atorvastatin (LIPITOR) 10 MG tablet Take 1 tablet (10 mg total) by mouth every Monday, Wednesday, and Friday. 15 tablet 0   losartan-hydrochlorothiazide (HYZAAR) 50-12.5 MG tablet TAKE 1 TABLET BY MOUTH ONCE DAILY 90 tablet 3   metFORMIN (GLUCOPHAGE-XR) 500 MG 24 hr tablet TAKE 2 TABLETS BY MOUTH IN THE  MORNING AND AT BEDTIME. 60 tablet 0   No facility-administered medications prior to visit.     Per HPI unless specifically indicated in ROS section below Review of Systems  Constitutional:  Negative for fatigue and fever.  HENT:  Negative for ear pain.   Eyes:  Negative for pain.  Respiratory:  Negative for cough and shortness of breath.   Cardiovascular:  Negative for chest pain, palpitations and leg swelling.  Gastrointestinal:  Negative for abdominal pain.  Genitourinary:  Negative for dysuria.  Musculoskeletal:  Negative for arthralgias.  Neurological:  Negative for syncope, light-headedness and headaches.  Psychiatric/Behavioral:  Negative for dysphoric mood.     Objective:  BP (!) 142/90   Pulse 72   Temp 97.8 F (36.6 C) (Temporal)   Ht '5\' 10"'$  (1.778 m)   Wt 259 lb (117.5 kg)   SpO2 98%   BMI 37.16 kg/m   Wt Readings from Last 3 Encounters:  09/21/22 259 lb (117.5 kg)  08/19/21 248 lb (112.5 kg)  09/14/20 259 lb (117.5 kg)      Physical Exam Constitutional:      Appearance: He is well-developed.  HENT:     Head: Normocephalic.     Right Ear: Hearing normal.     Left Ear: Hearing normal.     Nose: Nose normal.  Neck:     Thyroid: No thyroid mass or thyromegaly.     Vascular: No carotid bruit.     Trachea: Trachea normal.  Cardiovascular:     Rate and Rhythm: Normal rate and regular rhythm.     Pulses: Normal pulses.     Heart sounds: Heart sounds not distant. No murmur heard.    No friction rub. No gallop.     Comments: No peripheral edema Pulmonary:     Effort: Pulmonary effort is normal. No respiratory distress.     Breath sounds: Normal breath sounds.  Musculoskeletal:     Left elbow: Normal range of motion. Tenderness present in lateral epicondyle.  Skin:    General: Skin is warm and dry.     Findings: No rash.  Psychiatric:        Speech: Speech normal.        Behavior: Behavior normal.        Thought Content: Thought content normal.       Diabetic foot exam: Normal inspection No skin breakdown No calluses  Normal DP pulses Normal sensation to light touch and monofilament Nails normal  Results for orders placed or performed in visit on 09/18/22  Hemoglobin A1c  Result Value Ref Range   Hgb A1c MFr Bld 8.2 (H) 4.6 - 6.5 %  Lipid panel  Result Value Ref Range   Cholesterol 159 0 - 200 mg/dL   Triglycerides (H) 0.0 - 149.0 mg/dL    485.0 Triglyceride is over 400; calculations on Lipids are invalid.   HDL 27.60 (L) >39.00 mg/dL   Total CHOL/HDL Ratio 6   Comprehensive metabolic panel  Result Value Ref Range   Sodium 136 135 - 145 mEq/L   Potassium 3.7 3.5 - 5.1 mEq/L   Chloride 98 96 - 112 mEq/L    CO2 26 19 - 32 mEq/L   Glucose, Bld 175 (H) 70 - 99 mg/dL   BUN 14 6 - 23 mg/dL   Creatinine, Ser 1.00 0.40 - 1.50 mg/dL   Total Bilirubin 1.4 (H) 0.2 - 1.2 mg/dL   Alkaline Phosphatase 77 39 - 117 U/L   AST 36 0 -  37 U/L   ALT 54 (H) 0 - 53 U/L   Total Protein 7.2 6.0 - 8.3 g/dL   Albumin 4.8 3.5 - 5.2 g/dL   GFR 86.94 >60.00 mL/min   Calcium 10.0 8.4 - 10.5 mg/dL  LDL cholesterol, direct  Result Value Ref Range   Direct LDL 74.0 mg/dL     COVID 19 screen:  No recent travel or known exposure to COVID19 The patient denies respiratory symptoms of COVID 19 at this time. The importance of social distancing was discussed today.   Assessment and Plan The patient's preventative maintenance and recommended screening tests for an annual wellness exam were reviewed in full today. Brought up to date unless services declined.  Counselled on the importance of diet, exercise, and its role in overall health and mortality. The patient's FH and SH was reviewed, including their home life, tobacco status, and drug and alcohol status.     Vaccines: refused flu, consider shingrix, S/P COVID9 vaccine x 2,  Prostate Cancer Screen:  DUE Colon Cancer Screen: 09/14/2020 plan repeat in 10 years.      Smoking Status: none, occ cigar ETOH/ drug BPZ:WCHE/NIDP  Hep C:    done  HIV screen:  refused  Problem List Items Addressed This Visit     Fatty liver   Hyperlipidemia associated with type 2 diabetes mellitus (HCC)    LDL at goal on atorvastatin 10 mg 3 days a week but at high risk for  CVD.      Relevant Orders   Ambulatory referral to Cardiology   Hypertension associated with diabetes ([place])    Stable, chronic.  Continue current medication.   losartan hydrochlorothiazide 50/12.5 mg daily      Relevant Orders   Ambulatory referral to Cardiology   Lateral epicondylitis of left elbow    Acute, will treat with NSAIDs, ice and home physical therapy.  He will limit heavy lifting.       Severe obesity (BMI 35.0-39.9) with comorbidity ([place])    Chronic, increased weight in the last year.  Reviewed healthy eating habits, regular exercise and weight management.  He will consider starting Ozempic or other GLP1 medication.      Type 2 diabetes mellitus with hyperglycemia (HCC)    Chronic Significant worsening in the last year with poor diet and minimal exercise.  In the past he has done well on the current dose of metformin XL 500 mg four tablets daily.  He is planning to restart focus on healthy eating, decreasing eating out and increase physical activity.  We discussed the need for improved control in detail to decrease his cardiovascular risk. A GLP-1 medication would be an excellent choice for him.  He will consider Ozempic and will let me know.      Relevant Orders   Hemoglobin A1c   Lipid panel   Comprehensive metabolic panel   Microalbumin / creatinine urine ratio   Ambulatory referral to Cardiology   Other Visit Diagnoses     Routine general medical examination at a health care facility    -  Primary   Cardiac risk counseling       Relevant Orders   Ambulatory referral to Cardiology   Prostate cancer screening       Relevant Orders   PSA      Meds ordered this encounter  Medications   Semaglutide,0.25 or 0.'5MG'$ /DOS, (OZEMPIC, 0.25 OR 0.5 MG/DOSE,) 2 MG/3ML SOPN    Sig: Inject 0.25 mg into the skin once a  week.    Dispense:  3 mL    Refill:  11     [name], MD

## 2022-09-21 NOTE — Addendum Note (Signed)
Addended by: [name] E on: 09/21/2022 10:04 AM   Modules accepted: Orders

## 2022-09-21 NOTE — Assessment & Plan Note (Signed)
Stable, chronic.  Continue current medication.   losartan hydrochlorothiazide 50/12.5 mg daily 

## 2022-09-21 NOTE — Addendum Note (Signed)
Addended by: Francella Solian on: 09/21/2022 10:43 AM   Modules accepted: Orders

## 2022-09-21 NOTE — Assessment & Plan Note (Signed)
Chronic, increased weight in the last year.  Reviewed healthy eating habits, regular exercise and weight management.  He will consider starting Ozempic or other GLP1 medication.

## 2022-09-21 NOTE — Patient Instructions (Addendum)
Call with decision about GLP1 medication such as Ozempic.  Can use ibuprofen 600-800 mg three times daily for about 1 week with food.  Start home PT on left elbow, can also ice left elbow.  Avoid temporarily lifting > 10 lbs.

## 2022-09-21 NOTE — Assessment & Plan Note (Signed)
Chronic Significant worsening in the last year with poor diet and minimal exercise.  In the past he has done well on the current dose of metformin XL 500 mg four tablets daily.  He is planning to restart focus on healthy eating, decreasing eating out and increase physical activity.  We discussed the need for improved control in detail to decrease his cardiovascular risk. A GLP-1 medication would be an excellent choice for him.  He will consider Ozempic and will let me know.

## 2022-09-21 NOTE — Assessment & Plan Note (Addendum)
LDL at goal on atorvastatin 10 mg 3 days a week but at high risk for  CVD.

## 2022-09-21 NOTE — Assessment & Plan Note (Signed)
Acute, will treat with NSAIDs, ice and home physical therapy.  He will limit heavy lifting.

## 2022-10-11 ENCOUNTER — Other Ambulatory Visit: Payer: Self-pay

## 2022-10-17 ENCOUNTER — Other Ambulatory Visit (HOSPITAL_COMMUNITY): Payer: Self-pay

## 2022-10-18 ENCOUNTER — Encounter: Payer: Self-pay | Admitting: Internal Medicine

## 2022-10-18 ENCOUNTER — Ambulatory Visit: Payer: 59 | Attending: Internal Medicine | Admitting: Internal Medicine

## 2022-10-18 VITALS — BP 144/90 | HR 67 | Ht 71.0 in | Wt 257.4 lb

## 2022-10-18 DIAGNOSIS — Z7189 Other specified counseling: Secondary | ICD-10-CM | POA: Diagnosis not present

## 2022-10-18 DIAGNOSIS — I10 Essential (primary) hypertension: Secondary | ICD-10-CM

## 2022-10-18 NOTE — Patient Instructions (Signed)
Medication Instructions:  No Changes In Medications at this time.  *If you need a refill on your cardiac medications before your next appointment, please call your pharmacy*  Lab Work: None Ordered At This Time.  If you have labs (blood work) drawn today and your tests are completely normal, you will receive your results only by: [place] (if you have MyChart) OR A paper copy in the mail If you have any lab test that is abnormal or we need to change your treatment, we will call you to review the results.  Testing/Procedures: Dr. Harl Bowie has ordered a CT coronary calcium score.   Test locations:  [place]   This is $99 out of pocket.   Coronary CalciumScan A coronary calcium scan is an imaging test used to look for deposits of calcium and other fatty materials (plaques) in the inner lining of the blood vessels of the heart (coronary arteries). These deposits of calcium and plaques can partly clog and narrow the coronary arteries without producing any symptoms or warning signs. This puts a person at risk for a heart attack. This test can detect these deposits before symptoms develop. Tell a health care provider about: Any allergies you have. All medicines you are taking, including vitamins, herbs, eye drops, creams, and over-the-counter medicines. Any problems you or family members have had with anesthetic medicines. Any blood disorders you have. Any surgeries you have had. Any medical conditions you have. Whether you are pregnant or may be pregnant. What are the risks? Generally, this is a safe procedure. However, problems may occur, including: Harm to a pregnant woman and her unborn baby. This test involves the use of radiation. Radiation exposure can be dangerous to a pregnant woman and her unborn baby. If you are pregnant, you generally should not have this procedure done. Slight increase in the risk of cancer. This is because of the  radiation involved in the test. What happens before the procedure? No preparation is needed for this procedure. What happens during the procedure? You will undress and remove any jewelry around your neck or chest. You will put on a hospital gown. Sticky electrodes will be placed on your chest. The electrodes will be connected to an electrocardiogram (ECG) machine to record a tracing of the electrical activity of your heart. A CT scanner will take pictures of your heart. During this time, you will be asked to lie still and hold your breath for 2-3 seconds while a picture of your heart is being taken. The procedure may vary among health care providers and hospitals. What happens after the procedure? You can get dressed. You can return to your normal activities. It is up to you to get the results of your test. Ask your health care provider, or the department that is doing the test, when your results will be ready. Summary A coronary calcium scan is an imaging test used to look for deposits of calcium and other fatty materials (plaques) in the inner lining of the blood vessels of the heart (coronary arteries). Generally, this is a safe procedure. Tell your health care provider if you are pregnant or may be pregnant. No preparation is needed for this procedure. A CT scanner will take pictures of your heart. You can return to your normal activities after the scan is done. This information is not intended to replace advice given to you by your health care provider. Make sure you discuss any questions you have with your health care provider.  Document Released: 06/01/2008 Document Revised: 10/23/2016 Document Reviewed: 10/23/2016 Elsevier Interactive Patient Education  2017 [place]: At [facility], you and your health needs are our priority.  As part of our continuing mission to provide you with exceptional heart care, we have created designated Provider Care Teams.  These  Care Teams include your primary Cardiologist (physician) and Advanced Practice Providers (APPs -  Physician Assistants and Nurse Practitioners) who all work together to provide you with the care you need, when you need it.  Your next appointment:   AS NEEDED   The format for your next appointment:   In Person  Provider:   Janina Mayo, MD     Other Instructions Please obtain a blood pressure cuff (Omron) at [place] or [employer]. Please check blood pressures for one week. If they are on average > 130/80 mmHg then let us know . A1c goal <7.  How to Take Your Blood Pressure Blood pressure measures how strongly your blood is pressing against the walls of your arteries. Arteries are blood vessels that carry blood from your heart throughout your body. You can take your blood pressure at home with a machine. You may need to check your blood pressure at home: To check if you have high blood pressure (hypertension). To check your blood pressure over time. To make sure your blood pressure medicine is working. Supplies needed: Blood pressure machine, or monitor. A chair to sit in. This should be a chair where you can sit upright with your back supported. Do not sit on a soft couch or an armchair. Table or desk. Small notebook. Pencil or pen. How to prepare Avoid these things for 30 minutes before checking your blood pressure: Having drinks with caffeine in them, such as coffee or tea. Drinking alcohol. Eating. Smoking. Exercising. Do these things five minutes before checking your blood pressure: Go to the bathroom and pee (urinate). Sit in a chair. Be quiet. Do not talk. How to take your blood pressure Follow the instructions that came with your machine. If you have a digital blood pressure monitor, these may be the instructions: Sit up straight. Place your feet on the floor. Do not cross your ankles or legs. Rest your left arm at the level of your heart. You may rest it on a table,  desk, or chair. Pull up your shirt sleeve. Wrap the blood pressure cuff around the upper part of your left arm. The cuff should be 1 inch (2.5 cm) above your elbow. It is best to wrap the cuff around bare skin. Fit the cuff snugly around your arm, but not too tightly. You should be able to place only one finger between the cuff and your arm. Place the cord so that it rests in the bend of your elbow. Press the power button. Sit quietly while the cuff fills with air and loses air. Write down the numbers on the screen. Wait 2-3 minutes and then repeat steps 1-10. What do the numbers mean? Two numbers make up your blood pressure. The first number is called systolic pressure. The second is called diastolic pressure. An example of a blood pressure reading is "120 over 80" (or 120/80). If you are an adult and do not have a medical condition, use this guide to find out if your blood pressure is normal: Normal First number: below 120. Second number: below 80. Elevated First number: 120-129. Second number: below 80. Hypertension stage 1 First number: 130-139. Second number: 80-89. Hypertension  stage 2 First number: 140 or above. Second number: 45 or above. Your blood pressure is above normal even if only the first or only the second number is above normal. Follow these instructions at home: Medicines Take over-the-counter and prescription medicines only as told by your doctor. Tell your doctor if your medicine is causing side effects. General instructions Check your blood pressure as often as your doctor tells you to. Check your blood pressure at the same time every day. Take your monitor to your next doctor's appointment. Your doctor will: Make sure you are using it correctly. Make sure it is working right. Understand what your blood pressure numbers should be. Keep all follow-up visits. General tips You will need a blood pressure machine or monitor. Your doctor can suggest a monitor.  You can buy one at a drugstore or online. When choosing one: Choose one with an arm cuff. Choose one that wraps around your upper arm. Only one finger should fit between your arm and the cuff. Do not choose one that measures your blood pressure from your wrist or finger. Where to find more information American Heart Association: www.heart.org Contact a doctor if: Your blood pressure keeps being high. Your blood pressure is suddenly low. Get help right away if: Your first blood pressure number is higher than 180. Your second blood pressure number is higher than 120. These symptoms may be an emergency. Do not wait to see if the symptoms will go away. Get help right away. Call 911. Summary Check your blood pressure at the same time every day. Avoid caffeine, alcohol, smoking, and exercise for 30 minutes before checking your blood pressure. Make sure you understand what your blood pressure numbers should be. This information is not intended to replace advice given to you by your health care provider. Make sure you discuss any questions you have with your health care provider. Document Revised: 08/18/2021 Document Reviewed: 08/18/2021 Elsevier Patient Education  .

## 2022-10-18 NOTE — Progress Notes (Signed)
Cardiology Office Note:    Date:  10/18/2022   ID:  Philip Shaffer, DOB Dec 27, 1970, MRN [id number]  PCP:  [name], MD   [place] Providers Cardiologist:  Janina Mayo, MD     Referring MD: [name], MD   No chief complaint on file. Cardiac Risk Counseling  History of Present Illness:    Philip Shaffer is a 51 y.o. male with a hx of T2DM (A1c 8.2), HLD, obesity, family hx of -father was 8 had an MI, friend had a CAC score and then he had coronary dx and he had a CABG. Social Hx- no cigarette smoking. 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 No consistent physical. He denies angina, dyspnea on exertion, lower extremity edema, PND or orthopnea.   Past Medical History:  Diagnosis Date   Diabetes mellitus without complication (HCC)    Hyperlipidemia    Hypertension    Nonspecific elevation of levels of transaminase or lactic acid dehydrogenase (LDH)     Past Surgical History:  Procedure Laterality Date   VASECTOMY  2000    Current Medications: Current Meds  Medication Sig   atorvastatin (LIPITOR) 10 MG tablet Take 1 tablet (10 mg total) by mouth every Monday, Wednesday, and Friday.   losartan-hydrochlorothiazide (HYZAAR) 50-12.5 MG tablet TAKE 1 TABLET BY MOUTH ONCE DAILY   metFORMIN (GLUCOPHAGE-XR) 500 MG 24 hr tablet TAKE 2 TABLETS BY MOUTH IN THE MORNING AND AT BEDTIME.   Semaglutide,0.25 or 0.'5MG'$ /DOS, (OZEMPIC, 0.25 OR 0.5 MG/DOSE,) 2 MG/3ML SOPN Inject 0.25 mg into the skin once a week.     Allergies:   Patient has no known allergies.   Social History   Socioeconomic History   Marital status: Married    Spouse name: Not on file   Number of children: Not on file   Years of education: Not on file   Highest education level: Not on file  Occupational History   Occupation: Environmental health practitioner    Employer: [employer]  Tobacco Use   Smoking status: Some Days    Types: Cigars   Smokeless tobacco: Never  Vaping Use   Vaping Use:  Never used  Substance and Sexual Activity   Alcohol use: Yes    Alcohol/week: 0.0 standard drinks of alcohol    Comment: occ   Drug use: Yes    Types: Other-see comments    Comment: THC-occ   Sexual activity: Not on file  Other Topics Concern   Not on file  Social History Narrative   Regular exercise-yes-plays basketball, coach baseball   Diet; no fruit, but some veggies, rare water, drinks a lot of caffeine in soda and sweet tes   Social Determinants of Health   Financial Resource Strain: Not on file  Food Insecurity: Not on file  Transportation Needs: Not on file  Physical Activity: Not on file  Stress: Not on file  Social Connections: Not on file     Family History: The patient's family history includes Colon cancer in his paternal aunt; Colon polyps in his father; Coronary artery disease in his paternal grandfather; Diabetes in his mother; Hyperlipidemia in his father; Hypertension in his father. There is no history of Esophageal cancer, Rectal cancer, or Stomach cancer.  ROS:   Please see the history of present illness.     All other systems reviewed and are negative.  EKGs/Labs/Other Studies Reviewed:    The following studies were reviewed today:   EKG:  EKG is  ordered  today.  The ekg ordered today demonstrates   10/18/2022- NSR  Recent Labs: 09/18/2022: ALT 54; BUN 14; Creatinine, Ser 1.00; Potassium 3.7; Sodium 136   Recent Lipid Panel    Component Value Date/Time   CHOL 159 09/18/2022 1600   TRIG (H) 09/18/2022 1600    485.0 Triglyceride is over 400; calculations on Lipids are invalid.   HDL 27.60 (L) 09/18/2022 1600   CHOLHDL 6 09/18/2022 1600   VLDL 33.4 08/11/2021 0810   LDLCALC 28 08/11/2021 0810   LDLDIRECT 74.0 09/18/2022 1600     Risk Assessment/Calculations:     Physical Exam:    VS:  Vitals:   10/18/22 0845  BP: (!) 144/90  Pulse: 67  SpO2: 95%     Wt Readings from Last 3 Encounters:  10/18/22 257 lb 6.4 oz (116.8 kg)   09/21/22 259 lb (117.5 kg)  08/19/21 248 lb (112.5 kg)     GEN:  Well nourished, well developed in no acute distress HEENT: Normal NECK: No JVD; No carotid bruits LYMPHATICS: No lymphadenopathy CARDIAC: RRR, no murmurs, rubs, gallops RESPIRATORY:  Clear to auscultation without rales, wheezing or rhonchi  ABDOMEN: Soft, non-tender, non-distended MUSCULOSKELETAL:  No edema; No deformity  SKIN: Warm and dry NEUROLOGIC:  Alert and oriented x 3 PSYCHIATRIC:  Normal affect   ASSESSMENT:    CVD Risk Prevention: Will obtain CAC score with CVD risk factors and concern. Recommend to continue with lifestyle modification and CVD risk mitigation (yearly A1c, lipid monitoring); we discussed cutting food proportions, healthier diet and exercise.  HTN: Discussed ambulatory monitoring. Elevated today. losartan 50-12.5 mg daily.   PLAN:    In order of problems listed above:  CAC score       Medication Adjustments/Labs and Tests Ordered: Current medicines are reviewed at length with the patient today.  Concerns regarding medicines are outlined above.  Orders Placed This Encounter  Procedures   CT CARDIAC SCORING (SELF PAY ONLY)   EKG 12-Lead   No orders of the defined types were placed in this encounter.   Patient Instructions  Medication Instructions:  No Changes In Medications at this time.  *If you need a refill on your cardiac medications before your next appointment, please call your pharmacy*  Lab Work: None Ordered At This Time.  If you have labs (blood work) drawn today and your tests are completely normal, you will receive your results only by: [place] (if you have MyChart) OR A paper copy in the mail If you have any lab test that is abnormal or we need to change your treatment, we will call you to review the results.  Testing/Procedures: Dr. Harl Bowie has ordered a CT coronary calcium score.   Test locations:  [place]   This  is $99 out of pocket.   Coronary CalciumScan A coronary calcium scan is an imaging test used to look for deposits of calcium and other fatty materials (plaques) in the inner lining of the blood vessels of the heart (coronary arteries). These deposits of calcium and plaques can partly clog and narrow the coronary arteries without producing any symptoms or warning signs. This puts a person at risk for a heart attack. This test can detect these deposits before symptoms develop. Tell a health care provider about: Any allergies you have. All medicines you are taking, including vitamins, herbs, eye drops, creams, and over-the-counter medicines. Any problems you or family members have had with anesthetic medicines. Any blood disorders you have. Any  surgeries you have had. Any medical conditions you have. Whether you are pregnant or may be pregnant. What are the risks? Generally, this is a safe procedure. However, problems may occur, including: Harm to a pregnant woman and her unborn baby. This test involves the use of radiation. Radiation exposure can be dangerous to a pregnant woman and her unborn baby. If you are pregnant, you generally should not have this procedure done. Slight increase in the risk of cancer. This is because of the radiation involved in the test. What happens before the procedure? No preparation is needed for this procedure. What happens during the procedure? You will undress and remove any jewelry around your neck or chest. You will put on a hospital gown. Sticky electrodes will be placed on your chest. The electrodes will be connected to an electrocardiogram (ECG) machine to record a tracing of the electrical activity of your heart. A CT scanner will take pictures of your heart. During this time, you will be asked to lie still and hold your breath for 2-3 seconds while a picture of your heart is being taken. The procedure may vary among health care providers and  hospitals. What happens after the procedure? You can get dressed. You can return to your normal activities. It is up to you to get the results of your test. Ask your health care provider, or the department that is doing the test, when your results will be ready. Summary A coronary calcium scan is an imaging test used to look for deposits of calcium and other fatty materials (plaques) in the inner lining of the blood vessels of the heart (coronary arteries). Generally, this is a safe procedure. Tell your health care provider if you are pregnant or may be pregnant. No preparation is needed for this procedure. A CT scanner will take pictures of your heart. You can return to your normal activities after the scan is done. This information is not intended to replace advice given to you by your health care provider. Make sure you discuss any questions you have with your health care provider. Document Released: 06/01/2008 Document Revised: 10/23/2016 Document Reviewed: 10/23/2016 Elsevier Interactive Patient Education  2017 [place]: At [facility], you and your health needs are our priority.  As part of our continuing mission to provide you with exceptional heart care, we have created designated Provider Care Teams.  These Care Teams include your primary Cardiologist (physician) and Advanced Practice Providers (APPs -  Physician Assistants and Nurse Practitioners) who all work together to provide you with the care you need, when you need it.  Your next appointment:   AS NEEDED   The format for your next appointment:   In Person  Provider:   Janina Mayo, MD     Other Instructions Please obtain a blood pressure cuff (Omron) at [place] or [facility]. Please check blood pressures for one week. If they are on average > 130/80 mmHg then let us know . A1c goal <7.  How to Take Your Blood Pressure Blood pressure measures how strongly your blood is pressing against the walls  of your arteries. Arteries are blood vessels that carry blood from your heart throughout your body. You can take your blood pressure at home with a machine. You may need to check your blood pressure at home: To check if you have high blood pressure (hypertension). To check your blood pressure over time. To make sure your blood pressure medicine is working. Supplies needed: Blood  pressure machine, or monitor. A chair to sit in. This should be a chair where you can sit upright with your back supported. Do not sit on a soft couch or an armchair. Table or desk. Small notebook. Pencil or pen. How to prepare Avoid these things for 30 minutes before checking your blood pressure: Having drinks with caffeine in them, such as coffee or tea. Drinking alcohol. Eating. Smoking. Exercising. Do these things five minutes before checking your blood pressure: Go to the bathroom and pee (urinate). Sit in a chair. Be quiet. Do not talk. How to take your blood pressure Follow the instructions that came with your machine. If you have a digital blood pressure monitor, these may be the instructions: Sit up straight. Place your feet on the floor. Do not cross your ankles or legs. Rest your left arm at the level of your heart. You may rest it on a table, desk, or chair. Pull up your shirt sleeve. Wrap the blood pressure cuff around the upper part of your left arm. The cuff should be 1 inch (2.5 cm) above your elbow. It is best to wrap the cuff around bare skin. Fit the cuff snugly around your arm, but not too tightly. You should be able to place only one finger between the cuff and your arm. Place the cord so that it rests in the bend of your elbow. Press the power button. Sit quietly while the cuff fills with air and loses air. Write down the numbers on the screen. Wait 2-3 minutes and then repeat steps 1-10. What do the numbers mean? Two numbers make up your blood pressure. The first number is called  systolic pressure. The second is called diastolic pressure. An example of a blood pressure reading is "120 over 80" (or 120/80). If you are an adult and do not have a medical condition, use this guide to find out if your blood pressure is normal: Normal First number: below 120. Second number: below 80. Elevated First number: 120-129. Second number: below 80. Hypertension stage 1 First number: 130-139. Second number: 80-89. Hypertension stage 2 First number: 140 or above. Second number: 44 or above. Your blood pressure is above normal even if only the first or only the second number is above normal. Follow these instructions at home: Medicines Take over-the-counter and prescription medicines only as told by your doctor. Tell your doctor if your medicine is causing side effects. General instructions Check your blood pressure as often as your doctor tells you to. Check your blood pressure at the same time every day. Take your monitor to your next doctor's appointment. Your doctor will: Make sure you are using it correctly. Make sure it is working right. Understand what your blood pressure numbers should be. Keep all follow-up visits. General tips You will need a blood pressure machine or monitor. Your doctor can suggest a monitor. You can buy one at a drugstore or online. When choosing one: Choose one with an arm cuff. Choose one that wraps around your upper arm. Only one finger should fit between your arm and the cuff. Do not choose one that measures your blood pressure from your wrist or finger. Where to find more information American Heart Association: www.heart.org Contact a doctor if: Your blood pressure keeps being high. Your blood pressure is suddenly low. Get help right away if: Your first blood pressure number is higher than 180. Your second blood pressure number is higher than 120. These symptoms may be an emergency. Do not  wait to see if the symptoms will go away. Get  help right away. Call 911. Summary Check your blood pressure at the same time every day. Avoid caffeine, alcohol, smoking, and exercise for 30 minutes before checking your blood pressure. Make sure you understand what your blood pressure numbers should be. This information is not intended to replace advice given to you by your health care provider. Make sure you discuss any questions you have with your health care provider. Document Revised: 08/18/2021 Document Reviewed: 08/18/2021 Elsevier Patient Education  [place], Janina Mayo, MD  10/18/2022 9:05 AM    [place]

## 2022-11-29 ENCOUNTER — Ambulatory Visit (HOSPITAL_BASED_OUTPATIENT_CLINIC_OR_DEPARTMENT_OTHER)
Admission: RE | Admit: 2022-11-29 | Discharge: 2022-11-29 | Disposition: A | Discharge status: Home / Self Care | Payer: 59 | Source: Ambulatory Visit | Attending: Internal Medicine | Admitting: Internal Medicine

## 2022-11-29 DIAGNOSIS — Z7189 Other specified counseling: Secondary | ICD-10-CM | POA: Insufficient documentation

## 2022-12-08 ENCOUNTER — Ambulatory Visit
Admission: EM | Admit: 2022-12-08 | Discharge: 2022-12-08 | Disposition: A | Discharge status: Home / Self Care | Payer: 59 | Attending: Urgent Care | Admitting: Urgent Care

## 2022-12-08 DIAGNOSIS — R6889 Other general symptoms and signs: Secondary | ICD-10-CM | POA: Diagnosis not present

## 2022-12-08 DIAGNOSIS — R051 Acute cough: Secondary | ICD-10-CM | POA: Diagnosis not present

## 2022-12-08 MED ORDER — HYDROCOD POLI-CHLORPHE POLI ER 10-8 MG/5ML PO SUER: 5.0000 mL | Freq: Two times a day (BID) | ORAL | 0 refills | Status: AC | PRN

## 2022-12-08 MED ORDER — OSELTAMIVIR PHOSPHATE 75 MG PO CAPS: 75.0000 mg | ORAL_CAPSULE | Freq: Two times a day (BID) | ORAL | 0 refills | Status: DC

## 2022-12-08 MED ORDER — BENZONATATE 100 MG PO CAPS: ORAL_CAPSULE | ORAL | 0 refills | Status: DC

## 2022-12-08 NOTE — Discharge Instructions (Signed)
You have been diagnosed with a viral upper respiratory infection based on your symptoms and exam. Viral illnesses cannot be treated with antibiotics - they are self limiting - and you should find your symptoms resolving within a few days. Get plenty of rest and non-caffeinated fluids. Watch for signs of dehydration including reduced urine output and dark colored urine.  I have prescribed Tamiflu, antiviral therapy for influenza A, based on a presumptive diagnosis of influenza.    We recommend you use over-the-counter medications for symptom control including Tylenol or ibuprofen for fever, chills or body aches, and age-appropriate cold/cough medication.  Cough medication for daytime and nighttime use has been prescribed for you.  Saline mist spray is helpful for removing excess mucus from your nose.  Room humidifiers are helpful to ease breathing at night. I recommend guaifenesin (Mucinex) to help thin and loosen mucus secretions in your respiratory passages.   If appropriate based upon your other medical problems, you might also find relief of nasal/sinus congestion symptoms by using a nasal decongestant such as Flonase (fluticasone) or Sudafed sinus (pseudoephedrine).  You will need to obtain Sudafed from behind the pharmacist counter.  Speak to the pharmacist to verify that you are not duplicating medications with other over-the-counter formulations that you may be using.   Follow up here or with your primary care provider if your symptoms are worsening or not improving.

## 2022-12-08 NOTE — ED Provider Notes (Signed)
Philip Shaffer    CSN: [id number] Arrival date & time: 12/08/22  1808      History   Chief Complaint Chief Complaint  Patient presents with   Fever   Cough    HPI Philip Shaffer is a 51 y.o. male.    Fever Associated symptoms: cough   Cough Associated symptoms: fever     Presents to urgent care with complaint of fever starting today and productive cough starting yesterday.  Past Medical History:  Diagnosis Date   Diabetes mellitus without complication (HCC)    Hyperlipidemia    Hypertension    Nonspecific elevation of levels of transaminase or lactic acid dehydrogenase (LDH)     Patient Active Problem List   Diagnosis Date Noted   Lateral epicondylitis of left elbow 09/21/2022   Severe obesity (BMI 35.0-39.9) with comorbidity ([place]) 03/06/2017   Hyperlipidemia associated with type 2 diabetes mellitus ([place]) 10/19/2011   Fatty liver 03/31/2009   Hypertension associated with diabetes ([place]) 03/24/2009   Type 2 diabetes mellitus with hyperglycemia ([place]) 03/24/2009    Past Surgical History:  Procedure Laterality Date   VASECTOMY  2000       Home Medications    Prior to Admission medications   Medication Sig Start Date End Date Taking? Authorizing Provider  atorvastatin (LIPITOR) 10 MG tablet Take 1 tablet (10 mg total) by mouth every Monday, Wednesday, and Friday. 09/22/22 09/30/23  Bedsole, Amy E, MD  losartan-hydrochlorothiazide (HYZAAR) 50-12.5 MG tablet TAKE 1 TABLET BY MOUTH ONCE DAILY 09/21/22 09/21/23  Bedsole, Amy E, MD  metFORMIN (GLUCOPHAGE-XR) 500 MG 24 hr tablet TAKE 2 TABLETS BY MOUTH IN THE MORNING AND AT BEDTIME. 09/21/22 09/21/23  Bedsole, Amy E, MD  Semaglutide,0.25 or 0.'5MG'$ /DOS, (OZEMPIC, 0.25 OR 0.5 MG/DOSE,) 2 MG/3ML SOPN Inject 0.25 mg into the skin once a week. 09/21/22   [name], MD    Family History Family History  Problem Relation Age of Onset   Diabetes Mother    Hyperlipidemia Father    Hypertension Father     Colon polyps Father    Coronary artery disease Paternal Grandfather    Colon cancer Paternal Aunt        39's   Esophageal cancer Neg Hx    Rectal cancer Neg Hx    Stomach cancer Neg Hx     Social History Social History   Tobacco Use   Smoking status: Some Days    Types: Cigars   Smokeless tobacco: Never  Vaping Use   Vaping Use: Never used  Substance Use Topics   Alcohol use: Yes    Alcohol/week: 0.0 standard drinks of alcohol    Comment: occ   Drug use: Yes    Types: Other-see comments    Comment: THC-occ     Allergies   Patient has no known allergies.   Review of Systems Review of Systems  Constitutional:  Positive for fever.  Respiratory:  Positive for cough.      Physical Exam Triage Vital Signs ED Triage Vitals  Enc Vitals Group     BP 12/08/22 1905 121/76     Pulse Rate 12/08/22 1905 96     Resp 12/08/22 1905 18     Temp 12/08/22 1905 100 F (37.8 C)     Temp Source 12/08/22 1905 Oral     SpO2 12/08/22 1905 95 %     Weight --      Height --      Head Circumference --  Peak Flow --      Pain Score 12/08/22 1909 0     Pain Loc --      Pain Edu? --      Excl. in [place]? --    No data found.  Updated Vital Signs BP 121/76 (BP Location: Left Arm)   Pulse 96   Temp 100 F (37.8 C) (Oral)   Resp 18   SpO2 95%   Visual Acuity Right Eye Distance:   Left Eye Distance:   Bilateral Distance:    Right Eye Near:   Left Eye Near:    Bilateral Near:     Physical Exam Vitals reviewed.  Constitutional:      Appearance: He is ill-appearing.  Cardiovascular:     Rate and Rhythm: Normal rate and regular rhythm.     Pulses: Normal pulses.     Heart sounds: Normal heart sounds.  Pulmonary:     Effort: Pulmonary effort is normal.     Breath sounds: Normal breath sounds.  Skin:    General: Skin is warm and dry.  Neurological:     General: No focal deficit present.     Mental Status: He is alert and oriented to person, place, and time.   Psychiatric:        Mood and Affect: Mood normal.        Behavior: Behavior normal.      UC Treatments / Results  Labs (all labs ordered are listed, but only abnormal results are displayed) Labs Reviewed - No data to display  EKG   Radiology No results found.  Procedures Procedures (including critical care time)  Medications Ordered in UC Medications - No data to display  Initial Impression / Assessment and Plan / UC Course  I have reviewed the triage vital signs and the nursing notes.  Pertinent labs & imaging results that were available during my care of the patient were reviewed by me and considered in my medical decision making (see chart for details).   Patient is febrile here without recent antipyretics. Satting well on room air. Overall is ill appearing, well hydrated, without respiratory distress. Pulmonary exam is unremarkable.  Lungs CTAB without wheezing, rhonchi, rales.  Pharynx is not erythematous.  No peritonsillar exudates.  Suspect viral process, likely influenza and treating presumptively for influenza A with Tamiflu in the setting of and availability of flu tests.  Also recommending use of OTC medication for symptom control.  Providing benzonatate and Tussionex for cough.  Final Clinical Impressions(s) / UC Diagnoses   Final diagnoses:  None   Discharge Instructions   None    ED Prescriptions   None    PDMP not reviewed this encounter.   Rose Phi, [place] 12/08/22 1928

## 2022-12-08 NOTE — ED Triage Notes (Signed)
Pt. Presents to UC w/ c/o a fever that started today and a productive cough that started yesterday.

## 2022-12-19 ENCOUNTER — Other Ambulatory Visit: Payer: Self-pay

## 2022-12-19 ENCOUNTER — Telehealth: Payer: Self-pay | Admitting: Family Medicine

## 2022-12-19 DIAGNOSIS — E1165 Type 2 diabetes mellitus with hyperglycemia: Secondary | ICD-10-CM

## 2022-12-19 NOTE — Telephone Encounter (Signed)
-----   Message from Velna Hatchet, RT sent at 12/05/2022 12:02 PM EST ----- Regarding: Fri 1/5 lab Patient has a labs only appt on 12/22/22.  You have orders in for Hemoglobin A1c, Lipid Panel, and Comprehensive Metabolic Panel which are due to expire on 12/20/22.  If you still need these labs, please place a future order so these can be drawn.  Thanks, Anda Kraft

## 2022-12-20 ENCOUNTER — Other Ambulatory Visit: Payer: Self-pay

## 2022-12-22 ENCOUNTER — Ambulatory Visit: Payer: 59 | Admitting: Family Medicine

## 2022-12-22 ENCOUNTER — Other Ambulatory Visit (INDEPENDENT_AMBULATORY_CARE_PROVIDER_SITE_OTHER): Payer: 59

## 2022-12-22 DIAGNOSIS — E1165 Type 2 diabetes mellitus with hyperglycemia: Secondary | ICD-10-CM

## 2022-12-22 DIAGNOSIS — Z125 Encounter for screening for malignant neoplasm of prostate: Secondary | ICD-10-CM

## 2022-12-22 LAB — COMPREHENSIVE METABOLIC PANEL
ALT: 52 U/L (ref 0–53)
AST: 30 U/L (ref 0–37)
Albumin: 4.5 g/dL (ref 3.5–5.2)
Alkaline Phosphatase: 61 U/L (ref 39–117)
BUN: 14 mg/dL (ref 6–23)
CO2: 30 mEq/L (ref 19–32)
Calcium: 9.7 mg/dL (ref 8.4–10.5)
Chloride: 99 mEq/L (ref 96–112)
Creatinine, Ser: 0.98 mg/dL (ref 0.40–1.50)
GFR: 88.91 mL/min (ref >60.00)
Glucose, Bld: 143 mg/dL — ABNORMAL HIGH (ref 70–99)
Potassium: 4.3 mEq/L (ref 3.5–5.1)
Sodium: 138 mEq/L (ref 135–145)
Total Bilirubin: 1.3 mg/dL — ABNORMAL HIGH (ref 0.2–1.2)
Total Protein: 6.7 g/dL (ref 6.0–8.3)

## 2022-12-22 LAB — LIPID PANEL
Cholesterol: 123 mg/dL (ref 0–200)
HDL: 26.1 mg/dL — ABNORMAL LOW (ref >39.00)
NonHDL: 96.58
Total CHOL/HDL Ratio: 5
Triglycerides: 397 mg/dL — ABNORMAL HIGH (ref 0.0–149.0)
VLDL: 79.4 mg/dL — ABNORMAL HIGH (ref 0.0–40.0)

## 2022-12-22 LAB — LDL CHOLESTEROL, DIRECT: Direct LDL: 53 mg/dL

## 2022-12-22 LAB — HEMOGLOBIN A1C: Hgb A1c MFr Bld: 6.8 % — ABNORMAL HIGH (ref 4.6–6.5)

## 2022-12-22 LAB — PSA: PSA: 0.87 ng/mL (ref 0.10–4.00)

## 2022-12-22 NOTE — Progress Notes (Signed)
No critical labs need to be addressed urgently. We will discuss labs in detail at upcoming office visit.   

## 2022-12-22 NOTE — Addendum Note (Signed)
Addended by: Tammi Sou on: 12/22/2022 10:17 AM   Modules accepted: Orders

## 2022-12-29 ENCOUNTER — Other Ambulatory Visit (HOSPITAL_COMMUNITY): Payer: Self-pay

## 2022-12-29 ENCOUNTER — Ambulatory Visit: Payer: 59 | Admitting: Family Medicine

## 2022-12-29 ENCOUNTER — Encounter: Payer: Self-pay | Admitting: Family Medicine

## 2022-12-29 VITALS — BP 122/80 | HR 87 | Temp 97.8°F | Ht 70.0 in | Wt 253.2 lb

## 2022-12-29 DIAGNOSIS — I152 Hypertension secondary to endocrine disorders: Secondary | ICD-10-CM | POA: Diagnosis not present

## 2022-12-29 DIAGNOSIS — E1169 Type 2 diabetes mellitus with other specified complication: Secondary | ICD-10-CM | POA: Diagnosis not present

## 2022-12-29 DIAGNOSIS — E1159 Type 2 diabetes mellitus with other circulatory complications: Secondary | ICD-10-CM

## 2022-12-29 DIAGNOSIS — E1165 Type 2 diabetes mellitus with hyperglycemia: Secondary | ICD-10-CM

## 2022-12-29 DIAGNOSIS — Z23 Encounter for immunization: Secondary | ICD-10-CM | POA: Diagnosis not present

## 2022-12-29 DIAGNOSIS — E785 Hyperlipidemia, unspecified: Secondary | ICD-10-CM

## 2022-12-29 LAB — MICROALBUMIN / CREATININE URINE RATIO
Creatinine,U: 392 mg/dL
Microalb Creat Ratio: 0.5 mg/g (ref 0.0–30.0)
Microalb, Ur: 2 mg/dL — ABNORMAL HIGH (ref 0.0–1.9)

## 2022-12-29 MED ORDER — ATORVASTATIN CALCIUM 10 MG PO TABS
10.0000 mg | ORAL_TABLET | ORAL | 3 refills | Status: DC
  Filled 2022-12-29: qty 36, 84d supply, fill #0
  Filled 2023-01-02 (×2): qty 38, 89d supply, fill #0
  Filled 2023-03-23: qty 38, 89d supply, fill #1
  Filled 2023-06-26: qty 38, 89d supply, fill #2
  Filled 2023-08-07 – 2023-09-20 (×2): qty 38, 89d supply, fill #3
  Filled 2023-12-26: qty 8, 18d supply, fill #4

## 2022-12-29 NOTE — Assessment & Plan Note (Signed)
Stable, chronic.  Continue current medication.   losartan hydrochlorothiazide 50/12.5 mg daily

## 2022-12-29 NOTE — Assessment & Plan Note (Addendum)
Significant improvement in control ,chronic.  Continue current medication.  At this point in time he is not interested in increasing the Ozempic dose.  He will continue working on healthy lifestyle changes. As A1c continues to decrease we can work on decreasing metformin.  We did spend some time discussing administration of Ozempic as he is only getting 6 tips and has to discard Ozempic at the end of the  pen.  I have asked him to speak with his pharmacist about this.  Ozempic 0.25 mg weekly. Metformin XR 500 mg 2 tabs twice daily

## 2022-12-29 NOTE — Assessment & Plan Note (Signed)
Chronic, improving with semaglutide 0.25 mg weekly

## 2022-12-29 NOTE — Assessment & Plan Note (Addendum)
LDL at goal < 70 recent calcium CT score 0 suggesting low risk for coronary artery disease. Triglycerides remain above goal.   atorvastatin 10 mg 3 days a week   Recheck lipid to evaluate triglycerides in 3 months.

## 2022-12-29 NOTE — Progress Notes (Signed)
Patient ID: Philip Shaffer, male    DOB: 1971/07/24, 52 y.o.   MRN: [id number]  This visit was conducted in person.  BP 122/80   Pulse 87   Temp 97.8 F (36.6 C) (Oral)   Ht '5\' 10"'$  (1.778 m)   Wt 253 lb 4 oz (114.9 kg)   SpO2 96%   BMI 36.34 kg/m    CC:  Chief Complaint  Patient presents with   Diabetes    Subjective:   HPI: Philip Shaffer is a 52 y.o. male presenting on 12/29/2022 for Diabetes  Review [place] CT scoring with pt as well as OV with Dr. Harl Bowie from 11/2022.  Calcium score 0   Diabetes:  Improved!!  At goal on low dose metformin XR 500 mg 2 tabs twice  daily.  On ozempic 0.25 mg weekly.. no SE. Some decrease in appetite. Lab Results  Component Value Date   HGBA1C 6.8 (H) 12/22/2022  Using medications without difficulties: none Hypoglycemic episodes:none Hyperglycemic episodes: no Feet problems:none Blood Sugars averaging:not checking eye exam within last year: yes  Elevated Cholesterol:  LDL at goal on atorvastatin 10 mg daily Lab Results  Component Value Date   CHOL 123 12/22/2022   HDL 26.10 (L) 12/22/2022   LDLCALC 28 08/11/2021   LDLDIRECT 53.0 12/22/2022   TRIG 397.0 (H) 12/22/2022   CHOLHDL 5 12/22/2022  Using medications without problems: Muscle aches:  Diet compliance: moderate Exercise: Increased exercise.Marland Kitchen about 2 times a week. Other complaints:   10 lb weight loss Wt Readings from Last 3 Encounters:  12/29/22 253 lb 4 oz (114.9 kg)  10/18/22 257 lb 6.4 oz (116.8 kg)  09/21/22 259 lb (117.5 kg)    Hypertension:   At goal on losartan HCTZ 50/12.5 mg daily BP Readings from Last 3 Encounters:  12/29/22 122/80  12/08/22 121/76  10/18/22 (!) 144/90  Using medication without problems or lightheadedness:  none Chest pain with exertion: none Edema:none Short of breath:none Average home BPs: not checking Other issues:    Relevant past medical, surgical, family and social history reviewed and updated as indicated.  Interim medical history since our last visit reviewed. Allergies and medications reviewed and updated. Outpatient Medications Prior to Visit  Medication Sig Dispense Refill   losartan-hydrochlorothiazide (HYZAAR) 50-12.5 MG tablet TAKE 1 TABLET BY MOUTH ONCE DAILY 90 tablet 3   metFORMIN (GLUCOPHAGE-XR) 500 MG 24 hr tablet TAKE 2 TABLETS BY MOUTH IN THE MORNING AND AT BEDTIME. 360 tablet 1   Semaglutide,0.25 or 0.'5MG'$ /DOS, (OZEMPIC, 0.25 OR 0.5 MG/DOSE,) 2 MG/3ML SOPN Inject 0.25 mg into the skin once a week. 3 mL 11   atorvastatin (LIPITOR) 10 MG tablet Take 1 tablet (10 mg total) by mouth every Monday, Wednesday, and Friday. 40 tablet 3   benzonatate (TESSALON) 100 MG capsule Take 1-2 tablets 3 times a day as needed for cough 30 capsule 0   oseltamivir (TAMIFLU) 75 MG capsule Take 1 capsule (75 mg total) by mouth every 12 (twelve) hours. 10 capsule 0   No facility-administered medications prior to visit.     Per HPI unless specifically indicated in ROS section below Review of Systems  Constitutional:  Negative for fatigue and fever.  HENT:  Negative for ear pain.   Eyes:  Negative for pain.  Respiratory:  Negative for cough and shortness of breath.   Cardiovascular:  Negative for chest pain, palpitations and leg swelling.  Gastrointestinal:  Negative for abdominal pain.  Genitourinary:  Negative for dysuria.  Musculoskeletal:  Negative for arthralgias.  Neurological:  Negative for syncope, light-headedness and headaches.  Psychiatric/Behavioral:  Negative for dysphoric mood.    Objective:  BP 122/80   Pulse 87   Temp 97.8 F (36.6 C) (Oral)   Ht '5\' 10"'$  (1.778 m)   Wt 253 lb 4 oz (114.9 kg)   SpO2 96%   BMI 36.34 kg/m   Wt Readings from Last 3 Encounters:  12/29/22 253 lb 4 oz (114.9 kg)  10/18/22 257 lb 6.4 oz (116.8 kg)  09/21/22 259 lb (117.5 kg)      Physical Exam Constitutional:      Appearance: He is well-developed.  HENT:     Head: Normocephalic.     Right Ear:  Hearing normal.     Left Ear: Hearing normal.     Nose: Nose normal.  Neck:     Thyroid: No thyroid mass or thyromegaly.     Vascular: No carotid bruit.     Trachea: Trachea normal.  Cardiovascular:     Rate and Rhythm: Normal rate and regular rhythm.     Pulses: Normal pulses.     Heart sounds: Heart sounds not distant. No murmur heard.    No friction rub. No gallop.     Comments: No peripheral edema Pulmonary:     Effort: Pulmonary effort is normal. No respiratory distress.     Breath sounds: Normal breath sounds.  Skin:    General: Skin is warm and dry.     Findings: No rash.  Psychiatric:        Speech: Speech normal.        Behavior: Behavior normal.        Thought Content: Thought content normal.       Diabetic foot exam: Normal inspection No skin breakdown No calluses  Normal DP pulses Normal sensation to light touch and monofilament Nails normal  Results for orders placed or performed in visit on 12/22/22  Comprehensive metabolic panel  Result Value Ref Range   Sodium 138 135 - 145 mEq/L   Potassium 4.3 3.5 - 5.1 mEq/L   Chloride 99 96 - 112 mEq/L   CO2 30 19 - 32 mEq/L   Glucose, Bld 143 (H) 70 - 99 mg/dL   BUN 14 6 - 23 mg/dL   Creatinine, Ser 0.98 0.40 - 1.50 mg/dL   Total Bilirubin 1.3 (H) 0.2 - 1.2 mg/dL   Alkaline Phosphatase 61 39 - 117 U/L   AST 30 0 - 37 U/L   ALT 52 0 - 53 U/L   Total Protein 6.7 6.0 - 8.3 g/dL   Albumin 4.5 3.5 - 5.2 g/dL   GFR 88.91 >60.00 mL/min   Calcium 9.7 8.4 - 10.5 mg/dL  Lipid panel  Result Value Ref Range   Cholesterol 123 0 - 200 mg/dL   Triglycerides 397.0 (H) 0.0 - 149.0 mg/dL   HDL 26.10 (L) >39.00 mg/dL   VLDL 79.4 (H) 0.0 - 40.0 mg/dL   Total CHOL/HDL Ratio 5    NonHDL 96.58   Hemoglobin A1c  Result Value Ref Range   Hgb A1c MFr Bld 6.8 (H) 4.6 - 6.5 %  PSA  Result Value Ref Range   PSA 0.87 0.10 - 4.00 ng/mL  LDL cholesterol, direct  Result Value Ref Range   Direct LDL 53.0 mg/dL    This visit  occurred during the SARS-CoV-2 public health emergency.  Safety protocols were in place, including screening questions prior to the visit, additional usage  of staff PPE, and extensive cleaning of exam room while observing appropriate contact time as indicated for disinfecting solutions.   COVID 19 screen:  No recent travel or known exposure to COVID19 The patient denies respiratory symptoms of COVID 19 at this time. The importance of social distancing was discussed today.   Assessment and Plan   T  Problem List Items Addressed This Visit     Hyperlipidemia associated with type 2 diabetes mellitus ([place])    LDL at goal < 70 recent calcium CT score 0 suggesting low risk for coronary artery disease. Triglycerides remain above goal.   atorvastatin 10 mg 3 days a week   Recheck lipid to evaluate triglycerides in 3 months.      Relevant Medications   atorvastatin (LIPITOR) 10 MG tablet   Hypertension associated with diabetes (HCC)    Stable, chronic.  Continue current medication.   losartan hydrochlorothiazide 50/12.5 mg daily      Relevant Medications   atorvastatin (LIPITOR) 10 MG tablet   Severe obesity (BMI 35.0-39.9) with comorbidity (HCC)    Chronic, improving with semaglutide 0.25 mg weekly      Type 2 diabetes mellitus with hyperglycemia (HCC)    Significant improvement in control ,chronic.  Continue current medication.  At this point in time he is not interested in increasing the Ozempic dose.  He will continue working on healthy lifestyle changes. As A1c continues to decrease we can work on decreasing metformin.  We did spend some time discussing administration of Ozempic as he is only getting 6 tips and has to discard Ozempic at the end of the  pen.  I have asked him to speak with his pharmacist about this.  Ozempic 0.25 mg weekly. Metformin XR 500 mg 2 tabs twice daily        Relevant Medications   atorvastatin (LIPITOR) 10 MG tablet   Other Relevant Orders    Hemoglobin A1c   Lipid panel   Other Visit Diagnoses     Need for shingles vaccine    -  Primary   Relevant Orders   Zoster Recombinant (Shingrix ) (Completed)       [name], MD

## 2023-01-02 ENCOUNTER — Other Ambulatory Visit: Payer: Self-pay

## 2023-01-02 ENCOUNTER — Other Ambulatory Visit (HOSPITAL_COMMUNITY): Payer: Self-pay

## 2023-01-24 ENCOUNTER — Other Ambulatory Visit: Payer: Self-pay

## 2023-01-25 ENCOUNTER — Other Ambulatory Visit: Payer: Self-pay

## 2023-01-31 ENCOUNTER — Encounter: Payer: Self-pay | Admitting: Family Medicine

## 2023-01-31 ENCOUNTER — Other Ambulatory Visit: Payer: Self-pay

## 2023-02-01 ENCOUNTER — Other Ambulatory Visit: Payer: Self-pay

## 2023-02-15 ENCOUNTER — Telehealth: Payer: Self-pay

## 2023-02-15 ENCOUNTER — Other Ambulatory Visit (HOSPITAL_COMMUNITY): Payer: Self-pay

## 2023-02-15 NOTE — Telephone Encounter (Signed)
Patient Advocate Encounter  Prior Authorization for Ozempic (0.25 or 0.5 MG/DOSE) '2MG'$ /3ML pen-injectors has been approved.    Effective dates: 02/15/23 through 02/15/24

## 2023-02-15 NOTE — Telephone Encounter (Signed)
Pharmacy Patient Advocate Encounter   Received notification that prior authorization for Ozempic (0.25 or 0.5 MG/DOSE) '2MG'$ /3ML pen-injectors is required/requested.  Per Test Claim: PA required   PA submitted on 02/15/23 to (ins) [employer] via CoverMy[employer]eds [employer] or [facility]) confirmation # [id number] Status is pending

## 2023-03-23 ENCOUNTER — Other Ambulatory Visit: Payer: Self-pay | Admitting: Family Medicine

## 2023-03-23 ENCOUNTER — Other Ambulatory Visit: Payer: Self-pay

## 2023-03-23 MED ORDER — METFORMIN HCL ER 500 MG PO TB24
1000.0000 mg | ORAL_TABLET | Freq: Two times a day (BID) | ORAL | 1 refills | Status: DC
  Filled 2023-03-23: qty 360, 90d supply, fill #0
  Filled 2023-06-26: qty 360, 90d supply, fill #1

## 2023-03-30 ENCOUNTER — Other Ambulatory Visit (INDEPENDENT_AMBULATORY_CARE_PROVIDER_SITE_OTHER): Payer: 59

## 2023-03-30 DIAGNOSIS — E1165 Type 2 diabetes mellitus with hyperglycemia: Secondary | ICD-10-CM | POA: Diagnosis not present

## 2023-03-30 LAB — LDL CHOLESTEROL, DIRECT: Direct LDL: 80 mg/dL

## 2023-03-30 LAB — LIPID PANEL
Cholesterol: 140 mg/dL (ref 0–200)
HDL: 29.5 mg/dL — ABNORMAL LOW (ref >39.00)
NonHDL: 110.47
Total CHOL/HDL Ratio: 5
Triglycerides: 273 mg/dL — ABNORMAL HIGH (ref 0.0–149.0)
VLDL: 54.6 mg/dL — ABNORMAL HIGH (ref 0.0–40.0)

## 2023-03-30 LAB — HEMOGLOBIN A1C: Hgb A1c MFr Bld: 7.4 % — ABNORMAL HIGH (ref 4.6–6.5)

## 2023-06-26 ENCOUNTER — Other Ambulatory Visit: Payer: Self-pay

## 2023-06-28 ENCOUNTER — Encounter: Payer: Self-pay | Admitting: Internal Medicine

## 2023-08-07 ENCOUNTER — Other Ambulatory Visit: Payer: Self-pay | Admitting: Family Medicine

## 2023-08-07 ENCOUNTER — Other Ambulatory Visit: Payer: Self-pay

## 2023-08-07 MED ORDER — LOSARTAN POTASSIUM-HCTZ 50-12.5 MG PO TABS
1.0000 | ORAL_TABLET | Freq: Every day | ORAL | 0 refills | Status: DC
  Filled 2023-08-07 – 2023-09-20 (×2): qty 90, 90d supply, fill #0

## 2023-08-07 MED ORDER — METFORMIN HCL ER 500 MG PO TB24
1000.0000 mg | ORAL_TABLET | Freq: Two times a day (BID) | ORAL | 0 refills | Status: DC
  Filled 2023-08-07 – 2023-09-20 (×2): qty 360, 90d supply, fill #0

## 2023-08-07 NOTE — Telephone Encounter (Signed)
Please schedule CPE with fasting labs prior for after 09/22/2023 with Dr. Ermalene Searing.

## 2023-08-07 NOTE — Telephone Encounter (Signed)
Lvmtcb, sent mychart message  

## 2023-09-04 ENCOUNTER — Telehealth: Payer: Self-pay | Admitting: *Deleted

## 2023-09-04 DIAGNOSIS — E1165 Type 2 diabetes mellitus with hyperglycemia: Secondary | ICD-10-CM

## 2023-09-04 DIAGNOSIS — E1169 Type 2 diabetes mellitus with other specified complication: Secondary | ICD-10-CM

## 2023-09-04 DIAGNOSIS — Z125 Encounter for screening for malignant neoplasm of prostate: Secondary | ICD-10-CM

## 2023-09-04 NOTE — Telephone Encounter (Signed)
-----   Message from Lovena Neighbours sent at 09/04/2023  3:16 PM EDT ----- Regarding: Labs for Thursday 10.3.24 Please put physical lab orders in future. Thank you, Denny Peon

## 2023-09-20 ENCOUNTER — Other Ambulatory Visit: Payer: 59

## 2023-09-20 ENCOUNTER — Other Ambulatory Visit: Payer: Self-pay

## 2023-09-20 DIAGNOSIS — E1169 Type 2 diabetes mellitus with other specified complication: Secondary | ICD-10-CM | POA: Diagnosis not present

## 2023-09-20 DIAGNOSIS — E785 Hyperlipidemia, unspecified: Secondary | ICD-10-CM | POA: Diagnosis not present

## 2023-09-20 DIAGNOSIS — E1165 Type 2 diabetes mellitus with hyperglycemia: Secondary | ICD-10-CM

## 2023-09-20 DIAGNOSIS — Z125 Encounter for screening for malignant neoplasm of prostate: Secondary | ICD-10-CM | POA: Diagnosis not present

## 2023-09-20 LAB — COMPREHENSIVE METABOLIC PANEL
ALT: 40 U/L (ref 0–53)
AST: 25 U/L (ref 0–37)
Albumin: 4.6 g/dL (ref 3.5–5.2)
Alkaline Phosphatase: 67 U/L (ref 39–117)
BUN: 15 mg/dL (ref 6–23)
CO2: 27 meq/L (ref 19–32)
Calcium: 9.6 mg/dL (ref 8.4–10.5)
Chloride: 100 meq/L (ref 96–112)
Creatinine, Ser: 1.09 mg/dL (ref 0.40–1.50)
GFR: 77.85 mL/min (ref >60.00)
Glucose, Bld: 135 mg/dL — ABNORMAL HIGH (ref 70–99)
Potassium: 3.8 meq/L (ref 3.5–5.1)
Sodium: 136 meq/L (ref 135–145)
Total Bilirubin: 1.8 mg/dL — ABNORMAL HIGH (ref 0.2–1.2)
Total Protein: 6.8 g/dL (ref 6.0–8.3)

## 2023-09-20 LAB — LIPID PANEL
Cholesterol: 107 mg/dL (ref 0–200)
HDL: 27.1 mg/dL — ABNORMAL LOW (ref >39.00)
LDL Cholesterol: 24 mg/dL (ref 0–99)
NonHDL: 80.13
Total CHOL/HDL Ratio: 4
Triglycerides: 279 mg/dL — ABNORMAL HIGH (ref 0.0–149.0)
VLDL: 55.8 mg/dL — ABNORMAL HIGH (ref 0.0–40.0)

## 2023-09-20 LAB — PSA: PSA: 1.09 ng/mL (ref 0.10–4.00)

## 2023-09-20 LAB — HEMOGLOBIN A1C: Hgb A1c MFr Bld: 6.4 % (ref 4.6–6.5)

## 2023-09-20 NOTE — Progress Notes (Signed)
No critical labs need to be addressed urgently. We will discuss labs in detail at upcoming office visit.   

## 2023-09-27 ENCOUNTER — Encounter: Payer: 59 | Admitting: Family Medicine

## 2023-10-04 ENCOUNTER — Other Ambulatory Visit: Payer: Self-pay

## 2023-10-04 ENCOUNTER — Ambulatory Visit: Payer: 59 | Admitting: Family Medicine

## 2023-10-04 ENCOUNTER — Encounter: Payer: Self-pay | Admitting: Family Medicine

## 2023-10-04 VITALS — BP 152/88 | HR 66 | Temp 98.4°F | Ht 69.75 in | Wt 249.4 lb

## 2023-10-04 DIAGNOSIS — I152 Hypertension secondary to endocrine disorders: Secondary | ICD-10-CM

## 2023-10-04 DIAGNOSIS — Z0000 Encounter for general adult medical examination without abnormal findings: Secondary | ICD-10-CM

## 2023-10-04 DIAGNOSIS — E1159 Type 2 diabetes mellitus with other circulatory complications: Secondary | ICD-10-CM

## 2023-10-04 DIAGNOSIS — E785 Hyperlipidemia, unspecified: Secondary | ICD-10-CM | POA: Diagnosis not present

## 2023-10-04 DIAGNOSIS — K760 Fatty (change of) liver, not elsewhere classified: Secondary | ICD-10-CM

## 2023-10-04 DIAGNOSIS — Z7985 Long-term (current) use of injectable non-insulin antidiabetic drugs: Secondary | ICD-10-CM | POA: Diagnosis not present

## 2023-10-04 DIAGNOSIS — E1169 Type 2 diabetes mellitus with other specified complication: Secondary | ICD-10-CM | POA: Diagnosis not present

## 2023-10-04 DIAGNOSIS — Z7984 Long term (current) use of oral hypoglycemic drugs: Secondary | ICD-10-CM | POA: Diagnosis not present

## 2023-10-04 DIAGNOSIS — E1165 Type 2 diabetes mellitus with hyperglycemia: Secondary | ICD-10-CM

## 2023-10-04 LAB — HM DIABETES FOOT EXAM

## 2023-10-04 MED ORDER — SEMAGLUTIDE (1 MG/DOSE) 4 MG/3ML ~~LOC~~ SOPN
1.0000 mg | PEN_INJECTOR | SUBCUTANEOUS | 3 refills | Status: DC
  Filled 2023-10-04: qty 3, 28d supply, fill #0
  Filled 2023-10-09: qty 6, 56d supply, fill #0
  Filled 2023-10-29: qty 3, 28d supply, fill #0
  Filled 2023-12-04: qty 3, 28d supply, fill #1
  Filled 2023-12-26: qty 3, 28d supply, fill #2
  Filled 2024-01-24: qty 3, 28d supply, fill #3
  Filled 2024-02-21: qty 3, 28d supply, fill #4
  Filled 2024-03-24 – 2024-04-07 (×2): qty 3, 28d supply, fill #5
  Filled 2024-05-07: qty 3, 28d supply, fill #6
  Filled 2024-05-20: qty 3, 28d supply, fill #7

## 2023-10-04 NOTE — Assessment & Plan Note (Addendum)
Stable, chronic.  Continue current medication. Initially elevated but on recheck improved control.  losartan hydrochlorothiazide 50/12.5 mg daily

## 2023-10-04 NOTE — Progress Notes (Addendum)
Patient ID: Philip Shaffer, male    DOB: 1971/08/12, 52 y.o.   MRN: [id number]  This visit was conducted in person.  BP (!) 160/80   Pulse 66   Temp 98.4 F (36.9 C) (Oral)   Ht 5' 9.75" (1.772 m)   Wt 249 lb 6 oz (113.1 kg)   SpO2 98%   BMI 36.04 kg/m    CC:  Chief Complaint  Patient presents with   Annual Exam    Subjective:   HPI: Philip Shaffer is a 52 y.o. male presenting on 10/04/2023 for Annual Exam  The patient presents for complete physical and review of chronic health problems. He/She also has the following acute concerns today: none  Elevated Cholesterol: LDL at goal on atorvastatin 10 mg 3 days a week.  Lab Results  Component Value Date   CHOL 107 09/20/2023   HDL 27.10 (L) 09/20/2023   LDLCALC 24 09/20/2023   LDLDIRECT 80.0 03/30/2023   TRIG 279.0 (H) 09/20/2023   CHOLHDL 4 09/20/2023  Using medications without problems: none Muscle aches:  none Diet compliance:  Exercise:  Other complaints:  Hypertension:  Blood pressure borderline control on losartan hydrochlorothiazide 50/12.5 mg daily BP Readings from Last 3 Encounters:  10/04/23 (!) 160/80  12/29/22 122/80  12/08/22 121/76  Using medication without problems or lightheadedness:  Chest pain with exertion: none Edema:none Short of breath none: Average home BPs: Other issues:  Diabetes:  Improved control on metformin 500 mg XR 2 tablets  BID  AND semaglutide 0.5 mg weekly Lab Results  Component Value Date   HGBA1C 6.4 09/20/2023  Using medications without difficulties: Hypoglycemic episodes: Hyperglycemic episodes: Feet problems: no ulcers Blood Sugars averaging: eye exam within last year: yes    Continued weight loss but slow. Noting decreased appetite, portion control. Wt Readings from Last 3 Encounters:  10/04/23 249 lb 6 oz (113.1 kg)  12/29/22 253 lb 4 oz (114.9 kg)  10/18/22 257 lb 6.4 oz (116.8 kg)       Relevant past medical, surgical, family and social  history reviewed and updated as indicated. Interim medical history since our last visit reviewed. Allergies and medications reviewed and updated. Outpatient Medications Prior to Visit  Medication Sig Dispense Refill   atorvastatin (LIPITOR) 10 MG tablet Take 1 tablet (10 mg total) by mouth every Monday, Wednesday, and Friday. 40 tablet 3   losartan-hydrochlorothiazide (HYZAAR) 50-12.5 MG tablet Take 1 tablet by mouth daily. 90 tablet 0   metFORMIN (GLUCOPHAGE-XR) 500 MG 24 hr tablet Take 2 tablets (1,000 mg total) by mouth in the morning and at bedtime. 360 tablet 0   Semaglutide,0.25 or 0.5MG /DOS, (OZEMPIC, 0.25 OR 0.5 MG/DOSE,) 2 MG/3ML SOPN Inject 0.25 mg into the skin once a week. 3 mL 11   No facility-administered medications prior to visit.     Per HPI unless specifically indicated in ROS section below Review of Systems  Constitutional:  Negative for fatigue and fever.  HENT:  Negative for ear pain.   Eyes:  Negative for pain.  Respiratory:  Negative for cough and shortness of breath.   Cardiovascular:  Negative for chest pain, palpitations and leg swelling.  Gastrointestinal:  Negative for abdominal pain.  Genitourinary:  Negative for dysuria.  Musculoskeletal:  Negative for arthralgias.  Neurological:  Negative for syncope, light-headedness and headaches.  Psychiatric/Behavioral:  Negative for dysphoric mood.    Objective:  BP (!) 160/80   Pulse 66   Temp 98.4 F (36.9 C) (Oral)  Ht 5' 9.75" (1.772 m)   Wt 249 lb 6 oz (113.1 kg)   SpO2 98%   BMI 36.04 kg/m   Wt Readings from Last 3 Encounters:  10/04/23 249 lb 6 oz (113.1 kg)  12/29/22 253 lb 4 oz (114.9 kg)  10/18/22 257 lb 6.4 oz (116.8 kg)      Physical Exam Constitutional:      Appearance: He is well-developed.  HENT:     Head: Normocephalic.     Right Ear: Hearing normal.     Left Ear: Hearing normal.     Nose: Nose normal.  Neck:     Thyroid: No thyroid mass or thyromegaly.     Vascular: No carotid  bruit.     Trachea: Trachea normal.  Cardiovascular:     Rate and Rhythm: Normal rate and regular rhythm.     Pulses: Normal pulses.     Heart sounds: Heart sounds not distant. No murmur heard.    No friction rub. No gallop.     Comments: No peripheral edema Pulmonary:     Effort: Pulmonary effort is normal. No respiratory distress.     Breath sounds: Normal breath sounds.  Musculoskeletal:     Left elbow: Normal range of motion. Tenderness present in lateral epicondyle.  Skin:    General: Skin is warm and dry.     Findings: No rash.  Psychiatric:        Speech: Speech normal.        Behavior: Behavior normal.        Thought Content: Thought content normal.       Diabetic foot exam: Normal inspection No skin breakdown No calluses  Normal DP pulses Normal sensation to light touch and monofilament Nails normal  Results for orders placed or performed in visit on 10/04/23  HM DIABETES FOOT EXAM  Result Value Ref Range   HM Diabetic Foot Exam done      COVID 19 screen:  No recent travel or known exposure to COVID19 The patient denies respiratory symptoms of COVID 19 at this time. The importance of social distancing was discussed today.   Assessment and Plan The patient's preventative maintenance and recommended screening tests for an annual wellness exam were reviewed in full today. Brought up to date unless services declined.  Counselled on the importance of diet, exercise, and its role in overall health and mortality. The patient's FH and SH was reviewed, including their home life, tobacco status, and drug and alcohol status.     Vaccines: refused flu, consider shingrix, S/P COVID9 vaccine x 2,  Prostate Cancer Screen:   Lab Results  Component Value Date   PSA 1.09 09/20/2023   PSA 0.87 12/22/2022   Colon Cancer Screen: 09/14/2020 plan repeat in 3 years. [name].      Smoking Status: none, occ cigar ETOH/ drug WUJ:WJXB/JYNW  Hep C:    done  HIV screen:   refused  Problem List Items Addressed This Visit     Fatty liver     Improved with diet changes, LFTs in nml range.      Hyperlipidemia associated with type 2 diabetes mellitus (HCC)    LDL at goal < 70   2023 calcium CT score 0 suggesting low risk for coronary artery disease. Triglycerides remain above goal.   atorvastatin 10 mg 3 days a week        Relevant Medications   Semaglutide, 1 MG/DOSE, 4 MG/3ML SOPN   Hypertension associated with diabetes (HCC)  Stable, chronic.  Continue current medication. Initially elevated but on recheck improved control.  losartan hydrochlorothiazide 50/12.5 mg daily      Relevant Medications   Semaglutide, 1 MG/DOSE, 4 MG/3ML SOPN   Severe obesity (BMI 35.0-39.9) with comorbidity (HCC)    Chronic, improving with semaglutide 0.25 mg weekly      Relevant Medications   Semaglutide, 1 MG/DOSE, 4 MG/3ML SOPN   Type 2 diabetes mellitus with hyperglycemia (HCC)    Significant improvement in control ,chronic.  Continue current medication, but for weight loss will try 1 mg weekly, if CBGs improving decreased metfomrin to 3 a day.  Ozempic 0.25 mg weekly. Metformin XR 500 mg 2 tabs twice daily        Relevant Medications   Semaglutide, 1 MG/DOSE, 4 MG/3ML SOPN   Other Visit Diagnoses     Routine general medical examination at a health care facility    -  Primary       Meds ordered this encounter  Medications   Semaglutide, 1 MG/DOSE, 4 MG/3ML SOPN    Sig: Inject 1 mg under the skin as directed once a week.    Dispense:  6 mL    Refill:  3     Kerby Nora, MD

## 2023-10-04 NOTE — Assessment & Plan Note (Addendum)
Significant improvement in control ,chronic.  Continue current medication, but for weight loss will try 1 mg weekly, if CBGs improving decreased metfomrin to 3 a day.  Ozempic 0.25 mg weekly. Metformin XR 500 mg 2 tabs twice daily

## 2023-10-04 NOTE — Assessment & Plan Note (Signed)
Chronic, improving with semaglutide 0.25 mg weekly

## 2023-10-04 NOTE — Assessment & Plan Note (Signed)
LDL at goal < 70   2023 calcium CT score 0 suggesting low risk for coronary artery disease. Triglycerides remain above goal.   atorvastatin 10 mg 3 days a week

## 2023-10-04 NOTE — Patient Instructions (Addendum)
Increase Semaglutide to 1 mg weekly.  Follow blood sugars.Marland Kitchen goa fasting is 80-120 in AMs.  If you continue losing weight and FBS are  80-100.. you can try to  go to  2 in morning and 1 at night.  Work on increased exercise to improve LDL.   Call to set up Colonoscopy [facility]  [phone]

## 2023-10-04 NOTE — Assessment & Plan Note (Addendum)
Improved with diet changes, LFTs in nml range.

## 2023-10-08 ENCOUNTER — Other Ambulatory Visit: Payer: Self-pay

## 2023-10-08 ENCOUNTER — Other Ambulatory Visit (HOSPITAL_BASED_OUTPATIENT_CLINIC_OR_DEPARTMENT_OTHER): Payer: Self-pay

## 2023-10-08 MED ORDER — OMRON 3 SERIES BP MONITOR DEVI
0 refills | Status: DC
  Filled 2023-10-08: qty 1, 1d supply, fill #0

## 2023-10-09 ENCOUNTER — Other Ambulatory Visit: Payer: Self-pay

## 2023-10-11 DIAGNOSIS — M9903 Segmental and somatic dysfunction of lumbar region: Secondary | ICD-10-CM | POA: Diagnosis not present

## 2023-10-11 DIAGNOSIS — M5385 Other specified dorsopathies, thoracolumbar region: Secondary | ICD-10-CM | POA: Diagnosis not present

## 2023-10-11 DIAGNOSIS — M9905 Segmental and somatic dysfunction of pelvic region: Secondary | ICD-10-CM | POA: Diagnosis not present

## 2023-10-11 DIAGNOSIS — G4489 Other headache syndrome: Secondary | ICD-10-CM | POA: Diagnosis not present

## 2023-10-11 DIAGNOSIS — M9902 Segmental and somatic dysfunction of thoracic region: Secondary | ICD-10-CM | POA: Diagnosis not present

## 2023-10-24 ENCOUNTER — Other Ambulatory Visit: Payer: Self-pay

## 2023-10-29 ENCOUNTER — Other Ambulatory Visit: Payer: Self-pay

## 2023-12-04 ENCOUNTER — Other Ambulatory Visit: Payer: Self-pay

## 2023-12-14 ENCOUNTER — Ambulatory Visit (AMBULATORY_SURGERY_CENTER): Payer: 59

## 2023-12-14 ENCOUNTER — Other Ambulatory Visit: Payer: Self-pay

## 2023-12-14 ENCOUNTER — Telehealth: Payer: Self-pay

## 2023-12-14 VITALS — Ht 71.0 in | Wt 249.0 lb

## 2023-12-14 DIAGNOSIS — Z860100 Personal history of colon polyps, unspecified: Secondary | ICD-10-CM

## 2023-12-14 MED ORDER — NA SULFATE-K SULFATE-MG SULF 17.5-3.13-1.6 GM/177ML PO SOLN
1.0000 | Freq: Once | ORAL | 0 refills | Status: AC
  Filled 2023-12-14: qty 354, 1d supply, fill #0

## 2023-12-14 NOTE — Progress Notes (Signed)
No egg or soy allergy known to patient  No issues known to pt with past sedation with any surgeries or procedures Patient denies ever being told they had issues or difficulty with intubation  No FH of Malignant Hyperthermia Pt is not on diet pills Pt is not on  home 02  Pt is not on blood thinners  Pt denies issues with constipation  No A fib or A flutter Have any cardiac testing pending--no Pt can ambulate indepedently Pt denies use of chewing tobacco Discussed diabetic I weight loss medication holds Discussed NSAID holds Checked BMI Pt instructed to use Single[employer] or GoodRx for a price reduction on prep  Patient's chart reviewed by Cathlyn Parsons CNRA prior to previsit and patient appropriate for the [facility].  Pre visit completed and red dot placed by patient's name on their procedure day (on provider's schedule).

## 2023-12-14 NOTE — Telephone Encounter (Signed)
Completed pre visit with nurse

## 2023-12-25 ENCOUNTER — Other Ambulatory Visit: Payer: Self-pay

## 2023-12-26 ENCOUNTER — Other Ambulatory Visit: Payer: Self-pay | Admitting: Family Medicine

## 2023-12-26 ENCOUNTER — Encounter: Payer: Self-pay | Admitting: Internal Medicine

## 2023-12-26 ENCOUNTER — Other Ambulatory Visit: Payer: Self-pay

## 2023-12-26 MED ORDER — ATORVASTATIN CALCIUM 10 MG PO TABS
10.0000 mg | ORAL_TABLET | ORAL | 3 refills | Status: DC
  Filled 2023-12-26: qty 38, 89d supply, fill #0
  Filled 2024-02-21 – 2024-04-07 (×3): qty 38, 89d supply, fill #1
  Filled 2024-05-20 – 2024-06-23 (×2): qty 38, 89d supply, fill #2
  Filled 2024-07-29 – 2024-09-24 (×3): qty 38, 89d supply, fill #3
  Filled 2024-10-28: qty 38, 89d supply, fill #4

## 2023-12-26 MED ORDER — METFORMIN HCL ER 500 MG PO TB24
1000.0000 mg | ORAL_TABLET | Freq: Two times a day (BID) | ORAL | 3 refills | Status: DC
  Filled 2023-12-26: qty 360, 90d supply, fill #0
  Filled 2024-02-21 – 2024-04-07 (×3): qty 360, 90d supply, fill #1
  Filled 2024-05-20: qty 360, 90d supply, fill #2

## 2023-12-26 MED ORDER — LOSARTAN POTASSIUM-HCTZ 50-12.5 MG PO TABS
1.0000 | ORAL_TABLET | Freq: Every day | ORAL | 3 refills | Status: DC
  Filled 2023-12-26: qty 90, 90d supply, fill #0
  Filled 2024-02-21 – 2024-04-07 (×3): qty 90, 90d supply, fill #1
  Filled 2024-05-20 – 2024-06-23 (×2): qty 90, 90d supply, fill #2
  Filled 2024-07-29 – 2024-09-24 (×3): qty 90, 90d supply, fill #3

## 2023-12-27 ENCOUNTER — Ambulatory Visit (AMBULATORY_SURGERY_CENTER): Payer: 59 | Admitting: Internal Medicine

## 2023-12-27 ENCOUNTER — Other Ambulatory Visit: Payer: Self-pay

## 2023-12-27 ENCOUNTER — Encounter: Payer: Self-pay | Admitting: Internal Medicine

## 2023-12-27 VITALS — BP 149/96 | HR 70 | Temp 97.3°F | Resp 16 | Ht 69.75 in | Wt 249.0 lb

## 2023-12-27 DIAGNOSIS — D175 Benign lipomatous neoplasm of intra-abdominal organs: Secondary | ICD-10-CM

## 2023-12-27 DIAGNOSIS — D122 Benign neoplasm of ascending colon: Secondary | ICD-10-CM

## 2023-12-27 DIAGNOSIS — E785 Hyperlipidemia, unspecified: Secondary | ICD-10-CM | POA: Diagnosis not present

## 2023-12-27 DIAGNOSIS — Z1211 Encounter for screening for malignant neoplasm of colon: Secondary | ICD-10-CM

## 2023-12-27 DIAGNOSIS — D124 Benign neoplasm of descending colon: Secondary | ICD-10-CM | POA: Diagnosis not present

## 2023-12-27 DIAGNOSIS — I10 Essential (primary) hypertension: Secondary | ICD-10-CM | POA: Diagnosis not present

## 2023-12-27 DIAGNOSIS — Z860101 Personal history of adenomatous and serrated colon polyps: Secondary | ICD-10-CM | POA: Diagnosis not present

## 2023-12-27 DIAGNOSIS — K5730 Diverticulosis of large intestine without perforation or abscess without bleeding: Secondary | ICD-10-CM | POA: Diagnosis not present

## 2023-12-27 DIAGNOSIS — Z860100 Personal history of colon polyps, unspecified: Secondary | ICD-10-CM

## 2023-12-27 DIAGNOSIS — E119 Type 2 diabetes mellitus without complications: Secondary | ICD-10-CM | POA: Diagnosis not present

## 2023-12-27 MED ORDER — SODIUM CHLORIDE 0.9 % IV SOLN: 500.0000 mL | INTRAVENOUS | Status: DC

## 2023-12-27 NOTE — Progress Notes (Signed)
 HISTORY OF PRESENT ILLNESS:  Philip Shaffer is a 53 y.o. male with a history of multiple adenomatous colon polyps.  Presents today for surveillance colonoscopy.  No complaints  REVIEW OF SYSTEMS:  All non-GI ROS negative except for  Past Medical History:  Diagnosis Date   Diabetes mellitus without complication (HCC)    Hyperlipidemia    Hypertension    Nonspecific elevation of levels of transaminase or lactic acid dehydrogenase (LDH)     Past Surgical History:  Procedure Laterality Date   VASECTOMY  2000    Social History [name]  reports that he has been smoking cigars. He has never used smokeless tobacco. He reports current alcohol use. He reports current drug use. Drug: Other-see comments.  family history includes Colon cancer in his paternal aunt; Colon polyps in his father; Coronary artery disease in his paternal grandfather; Diabetes in his mother; Hyperlipidemia in his father; Hypertension in his father.  No Known Allergies     PHYSICAL EXAMINATION: Vital signs: BP (!) 153/112   Pulse 84   Temp (!) 97.3 F (36.3 C) (Temporal)   Resp 10   Ht 5' 9.75 (1.772 m)   Wt 249 lb (112.9 kg)   SpO2 97%   BMI 35.98 kg/m  General: Well-developed, well-nourished, no acute distress HEENT: Sclerae are anicteric, conjunctiva pink. Oral mucosa intact Lungs: Clear Heart: Regular Abdomen: soft, nontender, nondistended, no obvious ascites, no peritoneal signs, normal bowel sounds. No organomegaly. Extremities: No edema Psychiatric: alert and oriented x3. Cooperative     ASSESSMENT:  Personal history of multiple adenomatous polyps   PLAN:  Surveillance colonoscopy

## 2023-12-27 NOTE — Patient Instructions (Addendum)
-  Handout on polyps, diverticulosis provided -await pathology results -repeat colonoscopy in 5 years for surveillance recommended. -Continue present medications    YOU HAD AN ENDOSCOPIC PROCEDURE TODAY AT [facility]:   Refer to the procedure report that was given to you for any specific questions about what was found during the examination.  If the procedure report does not answer your questions, please call your gastroenterologist to clarify.  If you requested that your care partner not be given the details of your procedure findings, then the procedure report has been included in a sealed envelope for you to review at your convenience later.  YOU SHOULD EXPECT: Some feelings of bloating in the abdomen. Passage of more gas than usual.  Walking can help get rid of the air that was put into your GI tract during the procedure and reduce the bloating. If you had a lower endoscopy (such as a colonoscopy or flexible sigmoidoscopy) you may notice spotting of blood in your stool or on the toilet paper. If you underwent a bowel prep for your procedure, you may not have a normal bowel movement for a few days.  Please Note:  You might notice some irritation and congestion in your nose or some drainage.  This is from the oxygen used during your procedure.  There is no need for concern and it should clear up in a day or so.  SYMPTOMS TO REPORT IMMEDIATELY:  Following lower endoscopy (colonoscopy or flexible sigmoidoscopy):  Excessive amounts of blood in the stool  Significant tenderness or worsening of abdominal pains  Swelling of the abdomen that is new, acute  Fever of 100F or higher  For urgent or emergent issues, a gastroenterologist can be reached at any hour by calling [phone]. Do not use MyChart messaging for urgent concerns.    DIET:  We do recommend a small meal at first, but then you may proceed to your regular diet.  Drink plenty of fluids but you should avoid  alcoholic beverages for 24 hours.  ACTIVITY:  You should plan to take it easy for the rest of today and you should NOT DRIVE or use heavy machinery until tomorrow (because of the sedation medicines used during the test).    FOLLOW UP: Our staff will call the number listed on your records the next business day following your procedure.  We will call around 7:15- 8:00 am to check on you and address any questions or concerns that you may have regarding the information given to you following your procedure. If we do not reach you, we will leave a message.     If any biopsies were taken you will be contacted by phone or by letter within the next 1-3 weeks.  Please call us at [phone] if you have not heard about the biopsies in 3 weeks.    SIGNATURES/CONFIDENTIALITY: You and/or your care partner have signed paperwork which will be entered into your electronic medical record.  These signatures attest to the fact that that the information above on your After Visit Summary has been reviewed and is understood.  Full responsibility of the confidentiality of this discharge information lies with you and/or your care-partner.

## 2023-12-27 NOTE — Progress Notes (Signed)
 Called to room to assist during endoscopic procedure.  Patient ID and intended procedure confirmed with present staff. Received instructions for my participation in the procedure from the performing physician.

## 2023-12-27 NOTE — Progress Notes (Signed)
 Vss nad trans to pacu

## 2023-12-27 NOTE — Addendum Note (Signed)
 Addended by: Murrell Redden D on: 12/27/2023 10:24 AM   Modules accepted: Orders

## 2023-12-27 NOTE — Progress Notes (Signed)
 Pt's states no medical or surgical changes since previsit or office visit.

## 2023-12-27 NOTE — Op Note (Signed)
 [facility] Patient Name: Philip Shaffer Procedure Date: 12/27/2023 8:23 AM MRN: [id number] Endoscopist: Norleen SAILOR. Abran , MD, [id number] Age: 53 Referring MD:  Date of Birth: 12/21/1970 Gender: Male Account #: [id number] Procedure:                Colonoscopy with cold snare polypectomy x 3 Indications:              High risk colon cancer surveillance: Personal                            history of multiple (3 or more) adenomas. Previous                            examination 2021 Medicines:                Monitored Anesthesia Care Procedure:                Pre-Anesthesia Assessment:                           - Prior to the procedure, a History and Physical                            was performed, and patient medications and                            allergies were reviewed. The patient's tolerance of                            previous anesthesia was also reviewed. The risks                            and benefits of the procedure and the sedation                            options and risks were discussed with the patient.                            All questions were answered, and informed consent                            was obtained. Prior Anticoagulants: The patient has                            taken no anticoagulant or antiplatelet agents. ASA                            Grade Assessment: II - A patient with mild systemic                            disease. After reviewing the risks and benefits,                            the patient was deemed in satisfactory condition to  undergo the procedure.                           After obtaining informed consent, the colonoscope                            was passed under direct vision. Throughout the                            procedure, the patient's blood pressure, pulse, and                            oxygen saturations were monitored continuously. The                            Olympus  CF-HQ190L ([id number]) Colonoscope was                            introduced through the anus and advanced to the the                            cecum, identified by appendiceal orifice and                            ileocecal valve. The ileocecal valve, appendiceal                            orifice, and rectum were photographed. The quality                            of the bowel preparation was good. The colonoscopy                            was performed without difficulty. The patient                            tolerated the procedure well. The bowel preparation                            used was SUPREP via split dose instruction. Scope In: 8:48:46 AM Scope Out: 9:11:08 AM Scope Withdrawal Time: 0 hours 19 minutes 18 seconds  Total Procedure Duration: 0 hours 22 minutes 22 seconds  Findings:                 Three polyps were found in the descending colon and                            ascending colon. The polyps were 3 to 5 mm in size.                            These polyps were removed with a cold snare.                            Resection and retrieval were complete.  Diverticula were found in the sigmoid colon.                            Incidental small lipoma, right colon.                           The exam was otherwise without abnormality on                            direct and retroflexion views. Complications:            No immediate complications. Estimated blood loss:                            None. Estimated Blood Loss:     Estimated blood loss: none. Impression:               - Three 3 to 5 mm polyps in the descending colon                            and in the ascending colon, removed with a cold                            snare. Resected and retrieved.                           - Diverticulosis in the sigmoid colon.                           - The examination was otherwise normal on direct                            and retroflexion  views. Recommendation:           - Repeat colonoscopy in 5 years for surveillance.                           - Patient has a contact number available for                            emergencies. The signs and symptoms of potential                            delayed complications were discussed with the                            patient. Return to normal activities tomorrow.                            Written discharge instructions were provided to the                            patient.                           - Resume previous diet.                           -  Continue present medications.                           - Await pathology results. Norleen SAILOR. Abran, MD 12/27/2023 9:34:20 AM This report has been signed electronically.

## 2023-12-28 ENCOUNTER — Telehealth: Payer: Self-pay

## 2023-12-28 NOTE — Telephone Encounter (Signed)
 Post procedure follow up call, no answer

## 2023-12-31 ENCOUNTER — Encounter: Payer: Self-pay | Admitting: Internal Medicine

## 2023-12-31 LAB — SURGICAL PATHOLOGY

## 2024-01-01 ENCOUNTER — Telehealth: Payer: Self-pay

## 2024-01-01 NOTE — Telephone Encounter (Signed)
 Pt called and wanted to know the pathology results. Path letter discussed with pt and questions were answered.

## 2024-02-05 ENCOUNTER — Other Ambulatory Visit (HOSPITAL_COMMUNITY): Payer: Self-pay

## 2024-02-21 ENCOUNTER — Other Ambulatory Visit: Payer: Self-pay

## 2024-03-07 ENCOUNTER — Telehealth: Payer: Self-pay | Admitting: *Deleted

## 2024-03-07 DIAGNOSIS — E1169 Type 2 diabetes mellitus with other specified complication: Secondary | ICD-10-CM

## 2024-03-07 DIAGNOSIS — E1165 Type 2 diabetes mellitus with hyperglycemia: Secondary | ICD-10-CM

## 2024-03-07 NOTE — Telephone Encounter (Signed)
-----   Message from Alvina Chou sent at 03/07/2024  2:26 PM EDT ----- Regarding: Lab orders for Thur, 4.10.25 Lab orders for DM f/u, thanks

## 2024-03-27 ENCOUNTER — Other Ambulatory Visit: Payer: 59

## 2024-04-01 ENCOUNTER — Encounter: Payer: Self-pay | Admitting: Family Medicine

## 2024-04-03 ENCOUNTER — Ambulatory Visit: Payer: 59 | Admitting: Family Medicine

## 2024-04-04 ENCOUNTER — Other Ambulatory Visit: Payer: Self-pay

## 2024-04-07 ENCOUNTER — Other Ambulatory Visit: Payer: Self-pay

## 2024-04-08 ENCOUNTER — Ambulatory Visit: Admitting: Family Medicine

## 2024-05-07 ENCOUNTER — Other Ambulatory Visit: Payer: Self-pay

## 2024-05-20 ENCOUNTER — Other Ambulatory Visit: Payer: Self-pay

## 2024-05-28 ENCOUNTER — Other Ambulatory Visit (INDEPENDENT_AMBULATORY_CARE_PROVIDER_SITE_OTHER)

## 2024-05-28 DIAGNOSIS — E1169 Type 2 diabetes mellitus with other specified complication: Secondary | ICD-10-CM

## 2024-05-28 DIAGNOSIS — E1165 Type 2 diabetes mellitus with hyperglycemia: Secondary | ICD-10-CM

## 2024-05-28 DIAGNOSIS — E785 Hyperlipidemia, unspecified: Secondary | ICD-10-CM | POA: Diagnosis not present

## 2024-05-28 NOTE — Addendum Note (Signed)
 Addended by: Gerry Krone on: 05/28/2024 08:59 AM   Modules accepted: Orders

## 2024-05-29 ENCOUNTER — Ambulatory Visit: Payer: Self-pay | Admitting: Family Medicine

## 2024-05-29 LAB — COMPREHENSIVE METABOLIC PANEL WITH GFR
AG Ratio: 2 (calc) (ref 1.0–2.5)
ALT: 31 U/L (ref 9–46)
AST: 19 U/L (ref 10–35)
Albumin: 4.5 g/dL (ref 3.6–5.1)
Alkaline phosphatase (APISO): 70 U/L (ref 35–144)
BUN: 14 mg/dL (ref 7–25)
CO2: 26 mmol/L (ref 20–32)
Calcium: 9.6 mg/dL (ref 8.6–10.3)
Chloride: 98 mmol/L (ref 98–110)
Creat: 1 mg/dL (ref 0.70–1.30)
Globulin: 2.3 g/dL (ref 1.9–3.7)
Glucose, Bld: 138 mg/dL — ABNORMAL HIGH (ref 65–99)
Potassium: 3.8 mmol/L (ref 3.5–5.3)
Sodium: 139 mmol/L (ref 135–146)
Total Bilirubin: 1.4 mg/dL — ABNORMAL HIGH (ref 0.2–1.2)
Total Protein: 6.8 g/dL (ref 6.1–8.1)
eGFR: 90 mL/min/{1.73_m2} (ref >=60)

## 2024-05-29 LAB — MICROALBUMIN / CREATININE URINE RATIO
Creatinine, Urine: 247 mg/dL (ref 20–320)
Microalb Creat Ratio: 5 mg/g{creat} (ref <30)
Microalb, Ur: 1.2 mg/dL

## 2024-05-29 LAB — LIPID PANEL
Cholesterol: 111 mg/dL (ref <200)
HDL: 27 mg/dL — ABNORMAL LOW (ref >=40)
LDL Cholesterol (Calc): 55 mg/dL
Non-HDL Cholesterol (Calc): 84 mg/dL (ref <130)
Total CHOL/HDL Ratio: 4.1 (calc) (ref <5.0)
Triglycerides: 236 mg/dL — ABNORMAL HIGH (ref <150)

## 2024-05-29 LAB — HEMOGLOBIN A1C
Hgb A1c MFr Bld: 6.3 % — ABNORMAL HIGH (ref <5.7)
Mean Plasma Glucose: 134 mg/dL
eAG (mmol/L): 7.4 mmol/L

## 2024-05-29 NOTE — Progress Notes (Signed)
 No critical labs need to be addressed urgently. We will discuss labs in detail at upcoming office visit.

## 2024-05-30 ENCOUNTER — Ambulatory Visit: Admitting: Family Medicine

## 2024-05-30 ENCOUNTER — Other Ambulatory Visit (HOSPITAL_COMMUNITY): Payer: Self-pay

## 2024-05-30 ENCOUNTER — Telehealth: Payer: Self-pay

## 2024-05-30 ENCOUNTER — Other Ambulatory Visit: Payer: Self-pay

## 2024-05-30 ENCOUNTER — Encounter: Payer: Self-pay | Admitting: Family Medicine

## 2024-05-30 VITALS — BP 110/80 | HR 74 | Temp 98.2°F | Ht 69.75 in | Wt 247.5 lb

## 2024-05-30 DIAGNOSIS — E1165 Type 2 diabetes mellitus with hyperglycemia: Secondary | ICD-10-CM | POA: Diagnosis not present

## 2024-05-30 DIAGNOSIS — E1159 Type 2 diabetes mellitus with other circulatory complications: Secondary | ICD-10-CM

## 2024-05-30 DIAGNOSIS — Z7984 Long term (current) use of oral hypoglycemic drugs: Secondary | ICD-10-CM | POA: Diagnosis not present

## 2024-05-30 DIAGNOSIS — Z7184 Encounter for health counseling related to travel: Secondary | ICD-10-CM | POA: Diagnosis not present

## 2024-05-30 DIAGNOSIS — E785 Hyperlipidemia, unspecified: Secondary | ICD-10-CM | POA: Diagnosis not present

## 2024-05-30 DIAGNOSIS — E1169 Type 2 diabetes mellitus with other specified complication: Secondary | ICD-10-CM | POA: Diagnosis not present

## 2024-05-30 DIAGNOSIS — I152 Hypertension secondary to endocrine disorders: Secondary | ICD-10-CM | POA: Diagnosis not present

## 2024-05-30 MED ORDER — DOXYCYCLINE MONOHYDRATE 100 MG PO CAPS
ORAL_CAPSULE | ORAL | 0 refills | Status: DC
Start: 2024-05-30 — End: 2024-10-14
  Filled 2024-05-30: qty 40, 20d supply, fill #0

## 2024-05-30 MED ORDER — METFORMIN HCL ER 500 MG PO TB24: 500.0000 mg | ORAL_TABLET | Freq: Two times a day (BID) | ORAL | Status: DC

## 2024-05-30 MED ORDER — SEMAGLUTIDE (2 MG/DOSE) 8 MG/3ML ~~LOC~~ SOPN
2.0000 mg | PEN_INJECTOR | SUBCUTANEOUS | 1 refills | Status: AC
  Filled 2024-05-30: qty 3, 28d supply, fill #0
  Filled 2024-06-19: qty 9, 84d supply, fill #1
  Filled 2024-06-23: qty 3, 28d supply, fill #1
  Filled 2024-07-29: qty 3, 28d supply, fill #2
  Filled 2024-09-01: qty 3, 28d supply, fill #3
  Filled 2024-09-24: qty 3, 28d supply, fill #4
  Filled 2024-10-28: qty 3, 28d supply, fill #5

## 2024-05-30 NOTE — Assessment & Plan Note (Signed)
 Significant improvement in control ,chronic.   Given minimal weight loss increase ozempic  to 2 mg and decrease metfomrin to 500 BID  Ozempic  2 mg weekly. Metformin  XR 500 mg 1 tabs twice daily

## 2024-05-30 NOTE — Progress Notes (Signed)
 Patient ID: Philip Shaffer, male    DOB: 01/28/1971, 53 y.o.   MRN: [id number]  This visit was conducted in person.  BP 110/80   Pulse 74   Temp 98.2 F (36.8 C) (Oral)   Ht 5' 9.75 (1.772 m)   Wt 247 lb 8 oz (112.3 kg)   SpO2 95%   BMI 35.77 kg/m    CC:  Chief Complaint  Patient presents with   Diabetes    Subjective:   HPI: Philip Shaffer is a 53 y.o. male presenting on 05/30/2024 for Diabetes   Going to [place] in late August.   Elevated Cholesterol: LDL at goal on atorvastatin  10 mg 3 days a week.  Lab Results  Component Value Date   CHOL 111 05/28/2024   HDL 27 (L) 05/28/2024   LDLCALC 55 05/28/2024   LDLDIRECT 80.0 03/30/2023   TRIG 236 (H) 05/28/2024   CHOLHDL 4.1 05/28/2024  Using medications without problems: none Muscle aches:  none Diet compliance:  Exercise:  Other complaints:  Hypertension:  Blood pressure borderline control on losartan  hydrochlorothiazide  50/12.5 mg daily BP Readings from Last 3 Encounters:  05/30/24 110/80  12/27/23 (!) 149/96  10/04/23 (!) 152/88  Using medication without problems or lightheadedness:  Chest pain with exertion: none Edema:none Short of breath none: Average home BPs: Other issues:  Diabetes:  Improved control on metformin  500 mg XR 2 tablets  BID  AND semaglutide  1 mg weekly Lab Results  Component Value Date   HGBA1C 6.3 (H) 05/28/2024  Using medications without difficulties: Hypoglycemic episodes: Hyperglycemic episodes: Feet problems: no ulcers Blood Sugars averaging: eye exam within last year: yes Neg Microalbumin   Continued weight loss but slow. Noting decreased appetite, portion control. Wt Readings from Last 3 Encounters:  05/30/24 247 lb 8 oz (112.3 kg)  12/27/23 249 lb (112.9 kg)  12/14/23 249 lb (112.9 kg)  Body mass index is 35.77 kg/m.      Relevant past medical, surgical, family and social history reviewed and updated as indicated. Interim medical history since our  last visit reviewed. Allergies and medications reviewed and updated. Outpatient Medications Prior to Visit  Medication Sig Dispense Refill   atorvastatin  (LIPITOR) 10 MG tablet Take 1 tablet (10 mg total) by mouth every Monday, Wednesday, and Friday. 40 tablet 3   losartan -hydrochlorothiazide  (HYZAAR ) 50-12.5 MG tablet Take 1 tablet by mouth daily. 90 tablet 3   metFORMIN  (GLUCOPHAGE -XR) 500 MG 24 hr tablet Take 2 tablets (1,000 mg total) by mouth in the morning and at bedtime. 360 tablet 3   Semaglutide , 1 MG/DOSE, 4 MG/3ML SOPN Inject 1 mg under the skin as directed once a week. 6 mL 3   Blood Pressure Monitoring (OMRON 3 SERIES BP MONITOR) DEVI Use to check blood pressure as needed 1 each 0   No facility-administered medications prior to visit.     Per HPI unless specifically indicated in ROS section below Review of Systems  Constitutional:  Negative for fatigue and fever.  HENT:  Negative for ear pain.   Eyes:  Negative for pain.  Respiratory:  Negative for cough and shortness of breath.   Cardiovascular:  Negative for chest pain, palpitations and leg swelling.  Gastrointestinal:  Negative for abdominal pain.  Genitourinary:  Negative for dysuria.  Musculoskeletal:  Negative for arthralgias.  Neurological:  Negative for syncope, light-headedness and headaches.  Psychiatric/Behavioral:  Negative for dysphoric mood.    Objective:  BP 110/80   Pulse 74  Temp 98.2 F (36.8 C) (Oral)   Ht 5' 9.75 (1.772 m)   Wt 247 lb 8 oz (112.3 kg)   SpO2 95%   BMI 35.77 kg/m   Wt Readings from Last 3 Encounters:  05/30/24 247 lb 8 oz (112.3 kg)  12/27/23 249 lb (112.9 kg)  12/14/23 249 lb (112.9 kg)      Physical Exam Constitutional:      Appearance: He is well-developed.  HENT:     Head: Normocephalic.     Right Ear: Hearing normal.     Left Ear: Hearing normal.     Nose: Nose normal.  Neck:     Thyroid: No thyroid mass or thyromegaly.     Vascular: No carotid bruit.      Trachea: Trachea normal.   Cardiovascular:     Rate and Rhythm: Normal rate and regular rhythm.     Pulses: Normal pulses.     Heart sounds: Heart sounds not distant. No murmur heard.    No friction rub. No gallop.     Comments: No peripheral edema Pulmonary:     Effort: Pulmonary effort is normal. No respiratory distress.     Breath sounds: Normal breath sounds.   Musculoskeletal:     Left elbow: Normal range of motion. Tenderness present in lateral epicondyle.   Skin:    General: Skin is warm and dry.     Findings: No rash.   Psychiatric:        Speech: Speech normal.        Behavior: Behavior normal.        Thought Content: Thought content normal.        Results for orders placed or performed in visit on 05/28/24  Lipid panel   Collection Time: 05/28/24  8:59 AM  Result Value Ref Range   Cholesterol 111 <200 mg/dL   HDL 27 (L) > OR = 40 mg/dL   Triglycerides 811 (H) <150 mg/dL   LDL Cholesterol (Calc) 55 mg/dL (calc)   Total CHOL/HDL Ratio 4.1 <5.0 (calc)   Non-HDL Cholesterol (Calc) 84 <914 mg/dL (calc)  Hemoglobin N8G   Collection Time: 05/28/24  8:59 AM  Result Value Ref Range   Hgb A1c MFr Bld 6.3 (H) <5.7 %   Mean Plasma Glucose 134 mg/dL   eAG (mmol/L) 7.4 mmol/L  Comprehensive metabolic panel   Collection Time: 05/28/24  8:59 AM  Result Value Ref Range   Glucose, Bld 138 (H) 65 - 99 mg/dL   BUN 14 7 - 25 mg/dL   Creat 9.56 2.13 - 0.86 mg/dL   eGFR 90 > OR = 60 VH/QIO/9.62X5   BUN/Creatinine Ratio SEE NOTE: 6 - 22 (calc)   Sodium 139 135 - 146 mmol/L   Potassium 3.8 3.5 - 5.3 mmol/L   Chloride 98 98 - 110 mmol/L   CO2 26 20 - 32 mmol/L   Calcium  9.6 8.6 - 10.3 mg/dL   Total Protein 6.8 6.1 - 8.1 g/dL   Albumin 4.5 3.6 - 5.1 g/dL   Globulin 2.3 1.9 - 3.7 g/dL (calc)   AG Ratio 2.0 1.0 - 2.5 (calc)   Total Bilirubin 1.4 (H) 0.2 - 1.2 mg/dL   Alkaline phosphatase (APISO) 70 35 - 144 U/L   AST 19 10 - 35 U/L   ALT 31 9 - 46 U/L  Microalbumin /  creatinine urine ratio   Collection Time: 05/28/24  8:59 AM  Result Value Ref Range   Creatinine, Urine 247 20 -  320 mg/dL   Microalb, Ur 1.2 mg/dL   Microalb Creat Ratio 5 <30 mg/g creat     COVID 19 screen:  No recent travel or known exposure to COVID19 The patient denies respiratory symptoms of COVID 19 at this time. The importance of social distancing was discussed today.   Assessment and Plan The patient's preventative maintenance and recommended screening tests for an annual wellness exam were reviewed in full today. Brought up to date unless services declined.  Counselled on the importance of diet, exercise, and its role in overall health and mortality. The patient's FH and SH was reviewed, including their home life, tobacco status, and drug and alcohol status.     Vaccines: refused flu, consider shingrix , S/P COVID9 vaccine x 2,  Prostate Cancer Screen:   Lab Results  Component Value Date   PSA 1.09 09/20/2023   PSA 0.87 12/22/2022   Colon Cancer Screen: 09/14/2020 plan repeat in 3 years. Elvin Hammer.      Smoking Status: none, occ cigar ETOH/ drug [id number]  Hep C:    done  HIV screen:  refused  Problem List Items Addressed This Visit     Hyperlipidemia associated with type 2 diabetes mellitus (HCC)   LDL at goal < 70   2023 calcium  CT score 0 suggesting low risk for coronary artery disease. Triglycerides remain above goal.   atorvastatin  10 mg 3 days a week        Relevant Medications   Semaglutide , 2 MG/DOSE, 8 MG/3ML SOPN   metFORMIN  (GLUCOPHAGE -XR) 500 MG 24 hr tablet   Hypertension associated with diabetes (HCC) - Primary   Stable, chronic.   losartan  hydrochlorothiazide  50/12.5 mg daily      Relevant Medications   Semaglutide , 2 MG/DOSE, 8 MG/3ML SOPN   metFORMIN  (GLUCOPHAGE -XR) 500 MG 24 hr tablet   Travel advice encounter    Reviewed CDC . gov  Given malaria prophylaxis. Doxycycline daily 1-2 days prior , during and 4 week after.  Get  2024-25 COVID vaccine, otherwise uptodate.  Form completed.       Type 2 diabetes mellitus with hyperglycemia (HCC)   Significant improvement in control ,chronic.   Given minimal weight loss increase ozempic  to 2 mg and decrease metfomrin to 500 BID  Ozempic  2 mg weekly. Metformin  XR 500 mg 1 tabs twice daily        Relevant Medications   Semaglutide , 2 MG/DOSE, 8 MG/3ML SOPN   metFORMIN  (GLUCOPHAGE -XR) 500 MG 24 hr tablet     Meds ordered this encounter  Medications   Semaglutide , 2 MG/DOSE, 8 MG/3ML SOPN    Sig: Inject 2 mg as directed once a week.    Dispense:  9 mL    Refill:  1   metFORMIN  (GLUCOPHAGE -XR) 500 MG 24 hr tablet    Sig: Take 1 tablet (500 mg total) by mouth in the morning and at bedtime.   doxycycline (MONODOX) 100 MG capsule    Sig: Take 1 tablet (100 mg total) by mouth daily. Start 2 days prior to arrival, continue through arrival and continue 4 week after return home    Dispense:  40 capsule    Refill:  0     Herby Lolling, MD

## 2024-05-30 NOTE — Assessment & Plan Note (Addendum)
 Reviewed CDC . gov  Given malaria prophylaxis. Doxycycline daily 1-2 days prior , during and 4 week after.  Get 2024-25 COVID vaccine, otherwise uptodate.  Form completed.

## 2024-05-30 NOTE — Assessment & Plan Note (Signed)
 Stable, chronic.   losartan  hydrochlorothiazide  50/12.5 mg daily

## 2024-05-30 NOTE — Telephone Encounter (Signed)
 Pharmacy Patient Advocate Encounter   Received notification from CoverMyMeds that prior authorization for Ozempic  8 is required/requested.   Insurance verification completed.   The patient is insured through [facility] .   Per test claim: Refill too soon. PA is not needed at this time. Medication was filled 05/30/24. Next eligible fill date is 06/22/24.

## 2024-05-30 NOTE — Assessment & Plan Note (Signed)
LDL at goal < 70   2023 calcium CT score 0 suggesting low risk for coronary artery disease. Triglycerides remain above goal.   atorvastatin 10 mg 3 days a week

## 2024-05-30 NOTE — Patient Instructions (Signed)
 Increase semaglutide  to 2 mg weekly.  Decrease metformin  to 1 tablet  twice daily.

## 2024-06-19 ENCOUNTER — Other Ambulatory Visit: Payer: Self-pay

## 2024-06-24 ENCOUNTER — Other Ambulatory Visit (HOSPITAL_COMMUNITY): Payer: Self-pay

## 2024-06-24 ENCOUNTER — Other Ambulatory Visit: Payer: Self-pay

## 2024-06-25 ENCOUNTER — Other Ambulatory Visit: Payer: Self-pay

## 2024-07-28 DIAGNOSIS — M9902 Segmental and somatic dysfunction of thoracic region: Secondary | ICD-10-CM | POA: Diagnosis not present

## 2024-07-28 DIAGNOSIS — M5386 Other specified dorsopathies, lumbar region: Secondary | ICD-10-CM | POA: Diagnosis not present

## 2024-07-28 DIAGNOSIS — M9905 Segmental and somatic dysfunction of pelvic region: Secondary | ICD-10-CM | POA: Diagnosis not present

## 2024-07-28 DIAGNOSIS — G4489 Other headache syndrome: Secondary | ICD-10-CM | POA: Diagnosis not present

## 2024-07-28 DIAGNOSIS — M9903 Segmental and somatic dysfunction of lumbar region: Secondary | ICD-10-CM | POA: Diagnosis not present

## 2024-07-28 DIAGNOSIS — M9901 Segmental and somatic dysfunction of cervical region: Secondary | ICD-10-CM | POA: Diagnosis not present

## 2024-07-29 ENCOUNTER — Other Ambulatory Visit: Payer: Self-pay

## 2024-08-04 DIAGNOSIS — H524 Presbyopia: Secondary | ICD-10-CM | POA: Diagnosis not present

## 2024-09-01 ENCOUNTER — Other Ambulatory Visit: Payer: Self-pay

## 2024-09-16 ENCOUNTER — Telehealth: Payer: Self-pay | Admitting: *Deleted

## 2024-09-16 DIAGNOSIS — E1165 Type 2 diabetes mellitus with hyperglycemia: Secondary | ICD-10-CM

## 2024-09-16 DIAGNOSIS — Z125 Encounter for screening for malignant neoplasm of prostate: Secondary | ICD-10-CM

## 2024-09-16 DIAGNOSIS — E1169 Type 2 diabetes mellitus with other specified complication: Secondary | ICD-10-CM

## 2024-09-16 NOTE — Telephone Encounter (Signed)
-----   Message from Veva JINNY Ferrari sent at 09/16/2024  2:48 PM EDT ----- Regarding: Lab orders for Fri, 10.17.25 Patient is scheduled for CPX labs, please order future labs, Thanks , Veva

## 2024-10-03 ENCOUNTER — Other Ambulatory Visit

## 2024-10-03 ENCOUNTER — Ambulatory Visit: Payer: Self-pay | Admitting: Family Medicine

## 2024-10-03 DIAGNOSIS — E1169 Type 2 diabetes mellitus with other specified complication: Secondary | ICD-10-CM

## 2024-10-03 DIAGNOSIS — E1165 Type 2 diabetes mellitus with hyperglycemia: Secondary | ICD-10-CM | POA: Diagnosis not present

## 2024-10-03 DIAGNOSIS — Z125 Encounter for screening for malignant neoplasm of prostate: Secondary | ICD-10-CM | POA: Diagnosis not present

## 2024-10-03 DIAGNOSIS — E785 Hyperlipidemia, unspecified: Secondary | ICD-10-CM

## 2024-10-03 LAB — COMPREHENSIVE METABOLIC PANEL WITH GFR
ALT: 35 U/L (ref 0–53)
AST: 23 U/L (ref 0–37)
Albumin: 4.8 g/dL (ref 3.5–5.2)
Alkaline Phosphatase: 73 U/L (ref 39–117)
BUN: 15 mg/dL (ref 6–23)
CO2: 29 meq/L (ref 19–32)
Calcium: 9.4 mg/dL (ref 8.4–10.5)
Chloride: 102 meq/L (ref 96–112)
Creatinine, Ser: 1.07 mg/dL (ref 0.40–1.50)
GFR: 79.02 mL/min (ref >60.00)
Glucose, Bld: 121 mg/dL — ABNORMAL HIGH (ref 70–99)
Potassium: 4.1 meq/L (ref 3.5–5.1)
Sodium: 140 meq/L (ref 135–145)
Total Bilirubin: 1.8 mg/dL — ABNORMAL HIGH (ref 0.2–1.2)
Total Protein: 7.1 g/dL (ref 6.0–8.3)

## 2024-10-03 LAB — LIPID PANEL
Cholesterol: 124 mg/dL (ref 0–200)
HDL: 26.3 mg/dL — ABNORMAL LOW (ref >39.00)
LDL Cholesterol: 51 mg/dL (ref 0–99)
NonHDL: 97.56
Total CHOL/HDL Ratio: 5
Triglycerides: 231 mg/dL — ABNORMAL HIGH (ref 0.0–149.0)
VLDL: 46.2 mg/dL — ABNORMAL HIGH (ref 0.0–40.0)

## 2024-10-03 LAB — HEMOGLOBIN A1C: Hgb A1c MFr Bld: 6 % (ref 4.6–6.5)

## 2024-10-03 LAB — PSA: PSA: 1.14 ng/mL (ref 0.10–4.00)

## 2024-10-03 NOTE — Progress Notes (Signed)
 No critical labs need to be addressed urgently. We will discuss labs in detail at upcoming office visit.

## 2024-10-14 ENCOUNTER — Encounter: Payer: Self-pay | Admitting: Family Medicine

## 2024-10-14 ENCOUNTER — Ambulatory Visit: Admitting: Family Medicine

## 2024-10-14 VITALS — BP 136/88 | HR 75 | Temp 97.7°F | Ht 70.25 in | Wt 248.1 lb

## 2024-10-14 DIAGNOSIS — E1159 Type 2 diabetes mellitus with other circulatory complications: Secondary | ICD-10-CM

## 2024-10-14 DIAGNOSIS — Z0000 Encounter for general adult medical examination without abnormal findings: Secondary | ICD-10-CM | POA: Diagnosis not present

## 2024-10-14 DIAGNOSIS — Z7985 Long-term (current) use of injectable non-insulin antidiabetic drugs: Secondary | ICD-10-CM | POA: Diagnosis not present

## 2024-10-14 DIAGNOSIS — E785 Hyperlipidemia, unspecified: Secondary | ICD-10-CM | POA: Diagnosis not present

## 2024-10-14 DIAGNOSIS — E1169 Type 2 diabetes mellitus with other specified complication: Secondary | ICD-10-CM | POA: Diagnosis not present

## 2024-10-14 DIAGNOSIS — I152 Hypertension secondary to endocrine disorders: Secondary | ICD-10-CM

## 2024-10-14 DIAGNOSIS — E1165 Type 2 diabetes mellitus with hyperglycemia: Secondary | ICD-10-CM

## 2024-10-14 DIAGNOSIS — Z23 Encounter for immunization: Secondary | ICD-10-CM

## 2024-10-14 LAB — HM DIABETES FOOT EXAM

## 2024-10-14 NOTE — Progress Notes (Signed)
 Patient ID: Philip Shaffer, male    DOB: May 18, 1971, 53 y.o.   MRN: [id number]  This visit was conducted in person.  BP 136/88   Pulse 75   Temp 97.7 F (36.5 C) (Temporal)   Ht 5' 10.25 (1.784 m)   Wt 248 lb 2 oz (112.5 kg)   SpO2 98%   BMI 35.35 kg/m    CC:  Chief Complaint  Patient presents with   Annual Exam    Subjective:   HPI: Philip Shaffer is a 53 y.o. male presenting on 10/14/2024 for Annual Exam  The patient presents for complete physical and review of chronic health problems. He/She also has the following acute concerns today: none  Elevated Cholesterol: LDL at goal on atorvastatin  10 mg 3 days a week.  Lab Results  Component Value Date   CHOL 124 10/03/2024   HDL 26.30 (L) 10/03/2024   LDLCALC 51 10/03/2024   LDLDIRECT 80.0 03/30/2023   TRIG 231.0 (H) 10/03/2024   CHOLHDL 5 10/03/2024  Using medications without problems: none Muscle aches:  none Diet compliance:  Exercise:  yard work Other complaints:  Hypertension:  Blood pressure borderline control on losartan  hydrochlorothiazide  50/12.5 mg daily BP Readings from Last 3 Encounters:  10/14/24 136/88  05/30/24 110/80  12/27/23 (!) 149/96  Using medication without problems or lightheadedness:  none Chest pain with exertion: none Edema:none Short of breath none: Average home BPs: 120/80 Other issues:  Diabetes:  Improved control on metformin  500 mg XR 1 tablets  BID  AND semaglutide  2 mg weekly Lab Results  Component Value Date   HGBA1C 6.0 10/03/2024  Using medications without difficulties: Hypoglycemic episodes: Hyperglycemic episodes: Feet problems: no ulcers Blood Sugars averaging: eye exam within last year: yes    Continued weight loss but slow. Noting decreased appetite, portion control. Wt Readings from Last 3 Encounters:  10/14/24 248 lb 2 oz (112.5 kg)  05/30/24 247 lb 8 oz (112.3 kg)  12/27/23 249 lb (112.9 kg)       Relevant past medical, surgical, family  and social history reviewed and updated as indicated. Interim medical history since our last visit reviewed. Allergies and medications reviewed and updated. Outpatient Medications Prior to Visit  Medication Sig Dispense Refill   atorvastatin  (LIPITOR) 10 MG tablet Take 1 tablet (10 mg total) by mouth every Monday, Wednesday, and Friday. 40 tablet 3   losartan -hydrochlorothiazide  (HYZAAR ) 50-12.5 MG tablet Take 1 tablet by mouth daily. 90 tablet 3   Semaglutide , 2 MG/DOSE, 8 MG/3ML SOPN Inject 2 mg as directed once a week. 9 mL 1   metFORMIN  (GLUCOPHAGE -XR) 500 MG 24 hr tablet Take 1 tablet (500 mg total) by mouth in the morning and at bedtime.     doxycycline  (MONODOX ) 100 MG capsule Take 1 tablet (100 mg total) by mouth daily. Start 2 days prior to arrival, continue through arrival and continue 4 week after return home 40 capsule 0   No facility-administered medications prior to visit.     Per HPI unless specifically indicated in ROS section below Review of Systems  Constitutional:  Negative for fatigue and fever.  HENT:  Negative for ear pain.   Eyes:  Negative for pain.  Respiratory:  Negative for cough and shortness of breath.   Cardiovascular:  Negative for chest pain, palpitations and leg swelling.  Gastrointestinal:  Negative for abdominal pain.  Genitourinary:  Negative for dysuria.  Musculoskeletal:  Negative for arthralgias.  Neurological:  Negative for syncope, light-headedness  and headaches.  Psychiatric/Behavioral:  Negative for dysphoric mood.    Objective:  BP 136/88   Pulse 75   Temp 97.7 F (36.5 C) (Temporal)   Ht 5' 10.25 (1.784 m)   Wt 248 lb 2 oz (112.5 kg)   SpO2 98%   BMI 35.35 kg/m   Wt Readings from Last 3 Encounters:  10/14/24 248 lb 2 oz (112.5 kg)  05/30/24 247 lb 8 oz (112.3 kg)  12/27/23 249 lb (112.9 kg)      Physical Exam Constitutional:      Appearance: He is well-developed.  HENT:     Head: Normocephalic.     Right Ear: Hearing normal.      Left Ear: Hearing normal.     Nose: Nose normal.  Neck:     Thyroid: No thyroid mass or thyromegaly.     Vascular: No carotid bruit.     Trachea: Trachea normal.  Cardiovascular:     Rate and Rhythm: Normal rate and regular rhythm.     Pulses: Normal pulses.     Heart sounds: Heart sounds not distant. No murmur heard.    No friction rub. No gallop.     Comments: No peripheral edema Pulmonary:     Effort: Pulmonary effort is normal. No respiratory distress.     Breath sounds: Normal breath sounds.  Musculoskeletal:     Left elbow: Normal range of motion. Tenderness present in lateral epicondyle.  Skin:    General: Skin is warm and dry.     Findings: No rash.  Psychiatric:        Speech: Speech normal.        Behavior: Behavior normal.        Thought Content: Thought content normal.      Diabetic foot exam: Normal inspection No skin breakdown No calluses  Normal DP pulses Normal sensation to light touch and monofilament Nails normal  Results for orders placed or performed in visit on 10/14/24  HM DIABETES FOOT EXAM   Collection Time: 10/14/24 12:00 AM  Result Value Ref Range   HM Diabetic Foot Exam done      COVID 19 screen:  No recent travel or known exposure to COVID19 The patient denies respiratory symptoms of COVID 19 at this time. The importance of social distancing was discussed today.   Assessment and Plan The patient's preventative maintenance and recommended screening tests for an annual wellness exam were reviewed in full today. Brought up to date unless services declined.  Counselled on the importance of diet, exercise, and its role in overall health and mortality. The patient's FH and SH was reviewed, including their home life, tobacco status, and drug and alcohol status.     Vaccines: refused flu, completed  shingrix , S/P COVID9 vaccine x 2,  given prevnar today Prostate Cancer Screen:   Lab Results  Component Value Date   PSA 1.14 10/03/2024    PSA 1.09 09/20/2023   PSA 0.87 12/22/2022   Colon Cancer Screen: 12/2023 plan repeat in 5 years. Abran.      Smoking Status: none, occ cigar ETOH/ drug [id number]  Hep C:    done  HIV screen:  refused  Problem List Items Addressed This Visit     Hyperlipidemia associated with type 2 diabetes mellitus (HCC)   LDL at goal < 70   2023 calcium  CT score 0 suggesting low risk for coronary artery disease. Triglycerides remain above goal.   atorvastatin  10 mg 3 days a week  Hypertension associated with diabetes (HCC)   Stable, chronic.   losartan  hydrochlorothiazide  50/12.5 mg daily      Severe obesity (BMI 35.0-39.9) with comorbidity (HCC)   Chronic, minimal cahnge with semaglutide  2 mg weekly      Type 2 diabetes mellitus with hyperglycemia (HCC)    Stable,chronic.   At last OV increased  ozempic  to 2 mg and decreased metfomrin to 500 BID    Ozempic  2 mg weekly.  Will stop metformin ... recheck A1C in 3 months.        Other Visit Diagnoses       Routine general medical examination at a health care facility    -  Primary     Need for vaccination against Streptococcus pneumoniae       Relevant Orders   Pneumococcal conjugate vaccine 20-valent (Prevnar 20) (Completed)       No orders of the defined types were placed in this encounter.    Greig Ring, MD

## 2024-10-14 NOTE — Assessment & Plan Note (Signed)
LDL at goal < 70   2023 calcium CT score 0 suggesting low risk for coronary artery disease. Triglycerides remain above goal.   atorvastatin 10 mg 3 days a week

## 2024-10-14 NOTE — Assessment & Plan Note (Addendum)
 Stable,chronic.   At last OV increased  ozempic  to 2 mg and decreased metfomrin to 500 BID    Ozempic  2 mg weekly.  Will stop metformin ... recheck A1C in 3 months.

## 2024-10-14 NOTE — Assessment & Plan Note (Signed)
 Chronic, minimal cahnge with semaglutide  2 mg weekly

## 2024-10-14 NOTE — Assessment & Plan Note (Signed)
 Stable, chronic.   losartan  hydrochlorothiazide  50/12.5 mg daily

## 2024-10-28 ENCOUNTER — Other Ambulatory Visit: Payer: Self-pay

## 2024-10-28 ENCOUNTER — Other Ambulatory Visit: Payer: Self-pay | Admitting: Family Medicine

## 2024-10-28 MED ORDER — LOSARTAN POTASSIUM-HCTZ 50-12.5 MG PO TABS
1.0000 | ORAL_TABLET | Freq: Every day | ORAL | 3 refills | Status: AC
  Filled 2024-10-28 – 2024-12-30 (×2): qty 90, 90d supply, fill #0
  Filled 2025-01-05: qty 30, 30d supply, fill #0

## 2024-10-29 ENCOUNTER — Other Ambulatory Visit: Payer: Self-pay

## 2024-12-30 ENCOUNTER — Other Ambulatory Visit: Payer: Self-pay

## 2024-12-30 ENCOUNTER — Other Ambulatory Visit: Payer: Self-pay | Admitting: Family Medicine

## 2024-12-30 MED ORDER — ATORVASTATIN CALCIUM 10 MG PO TABS
10.0000 mg | ORAL_TABLET | ORAL | 3 refills | Status: AC
  Filled 2024-12-30: qty 40, 93d supply, fill #0
  Filled 2025-01-05: qty 12, 28d supply, fill #0

## 2024-12-31 ENCOUNTER — Other Ambulatory Visit: Payer: Self-pay

## 2025-01-05 ENCOUNTER — Other Ambulatory Visit: Payer: Self-pay
